# Patient Record
Sex: Female | Born: 2000 | Race: Black or African American | Hispanic: No | Smoking: Former smoker
Health system: Southern US, Community
[De-identification: ages and names within clinical notes are randomized; demographics above are authoritative.]

---
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
## 2021-02-17 ENCOUNTER — Other Ambulatory Visit: Payer: Self-pay

## 2021-02-17 ENCOUNTER — Encounter: Payer: Self-pay | Admitting: Adult Health

## 2021-02-17 ENCOUNTER — Ambulatory Visit (INDEPENDENT_AMBULATORY_CARE_PROVIDER_SITE_OTHER): Payer: Medicaid Other | Admitting: Adult Health

## 2021-02-17 VITALS — BP 128/84 | HR 94 | Ht 66.0 in | Wt 317.0 lb

## 2021-02-17 DIAGNOSIS — Z3202 Encounter for pregnancy test, result negative: Secondary | ICD-10-CM

## 2021-02-17 DIAGNOSIS — Z3041 Encounter for surveillance of contraceptive pills: Secondary | ICD-10-CM | POA: Diagnosis not present

## 2021-02-17 DIAGNOSIS — N946 Dysmenorrhea, unspecified: Secondary | ICD-10-CM

## 2021-02-17 DIAGNOSIS — F419 Anxiety disorder, unspecified: Secondary | ICD-10-CM

## 2021-02-17 DIAGNOSIS — F32A Depression, unspecified: Secondary | ICD-10-CM

## 2021-02-17 LAB — POCT URINE PREGNANCY: Preg Test, Ur: NEGATIVE

## 2021-02-17 MED ORDER — BUSPIRONE HCL 5 MG PO TABS: 5.0000 mg | ORAL_TABLET | Freq: Three times a day (TID) | ORAL | 3 refills | Status: DC

## 2021-02-17 MED ORDER — NORETHINDRONE ACET-ETHINYL EST 1.5-30 MG-MCG PO TABS: 1.0000 | ORAL_TABLET | Freq: Every day | ORAL | 11 refills | Status: DC

## 2021-02-17 MED ORDER — ESCITALOPRAM OXALATE 20 MG PO TABS: 20.0000 mg | ORAL_TABLET | Freq: Every day | ORAL | 6 refills | Status: DC

## 2021-02-17 NOTE — Progress Notes (Signed)
  Subjective:     Patient ID: Andrea Hartman, female   DOB: [birth year], 20 y.o.   MRN: [id number]  HPI Timica is a 20 year old black female,single,G0P0, back in follow up on taking loestrin 1/20 for birth control, and cramps and to regular periods and periods regualr but still having cramps and more mood swings. She last saw [facility] in December, and has not seen [facility] yet. Seems more anxious.  Review of Systems +cramps +mood swings +anxiety  Reviewed past medical,surgical, social and family history. Reviewed medications and allergies.     Objective:   Physical Exam BP 128/84 (BP Location: Right Arm, Patient Position: Sitting, Cuff Size: Large)   Pulse 94   Ht 5\' 6"  (1.676 m)   Wt (!) 317 lb (143.8 kg)   BMI 51.17 kg/m UPT is negative.   Skin warm and dry. Lungs: clear to ausculation bilaterally. Cardiovascular: regular rate and rhythm. Fall risk is low  Upstream - 02/17/21 1455      Pregnancy Intention Screening   Does the patient want to become pregnant in the next year? No    Would the patient like to discuss contraceptive options today? No      Contraception Wrap Up   Current Method Oral Contraceptive    End Method Oral Contraceptive    Contraception Counseling Provided No         PHQ 9 score is 24 but denies any SI or HI, is on lexapro 20 mg , needs to see [facility] GAD 7 score is 21, will add buspar  Assessment:     1. Negative pregnancy test   2. Menstrual cramps Will change OCs to see if helps  3. Anxiety and depression Will refill lexapro 20 mg and add buspar 5 mg tid  4. Encounter for surveillance of contraceptive pills Finish current OCs then start loestrin 1.5.30 Meds ordered this encounter  Medications  . escitalopram (LEXAPRO) 20 MG tablet    Sig: Take 1 tablet (20 mg total) by mouth daily.    Dispense:  30 tablet    Refill:  6    Order Specific Question:   Supervising Provider    Answer:   3.5.30, LUTHER H [[id number]]  . Norethindrone  Acetate-Ethinyl Estradiol (LOESTRIN) 1.5-30 MG-MCG tablet    Sig: Take 1 tablet by mouth daily.    Dispense:  28 tablet    Refill:  11    Order Specific Question:   Supervising Provider    Answer:   10-16-1978, LUTHER H [[id number]]  . busPIRone (BUSPAR) 5 MG tablet    Sig: Take 1 tablet (5 mg total) by mouth 3 (three) times daily.    Dispense:  90 tablet    Refill:  3    Order Specific Question:   Supervising Provider    Answer:   Despina Hidden [[id number]]      Plan:    Will check GC/CHL on urine Make appt with [facility] Follow up in 3 months or sooner if needed

## 2021-02-18 ENCOUNTER — Telehealth: Payer: Self-pay

## 2021-02-18 LAB — GC/CHLAMYDIA PROBE AMP
Chlamydia trachomatis, NAA: NEGATIVE
Neisseria Gonorrhoeae by PCR: NEGATIVE

## 2021-02-18 NOTE — Telephone Encounter (Signed)
Asked Cyril Mourning, NP for a clarification as requested from [facility] for Loestrin prescription. Obtained verbal order for either the Loestrin FE or Loestrin 21, whichever was in stock. Clarification accepted.

## 2021-03-11 DIAGNOSIS — M25562 Pain in left knee: Secondary | ICD-10-CM | POA: Diagnosis not present

## 2021-05-21 ENCOUNTER — Other Ambulatory Visit (HOSPITAL_COMMUNITY)
Admission: RE | Admit: 2021-05-21 | Discharge: 2021-05-21 | Disposition: A | Discharge status: Home / Self Care | Payer: Medicaid Other | Source: Ambulatory Visit | Attending: Adult Health | Admitting: Adult Health

## 2021-05-21 ENCOUNTER — Other Ambulatory Visit: Payer: Self-pay

## 2021-05-21 ENCOUNTER — Ambulatory Visit (INDEPENDENT_AMBULATORY_CARE_PROVIDER_SITE_OTHER): Payer: Medicaid Other | Admitting: Adult Health

## 2021-05-21 ENCOUNTER — Encounter: Payer: Self-pay | Admitting: Adult Health

## 2021-05-21 VITALS — BP 153/87 | HR 99 | Ht 66.0 in | Wt 322.0 lb

## 2021-05-21 DIAGNOSIS — F419 Anxiety disorder, unspecified: Secondary | ICD-10-CM | POA: Diagnosis not present

## 2021-05-21 DIAGNOSIS — F32A Depression, unspecified: Secondary | ICD-10-CM | POA: Diagnosis not present

## 2021-05-21 DIAGNOSIS — Z113 Encounter for screening for infections with a predominantly sexual mode of transmission: Secondary | ICD-10-CM | POA: Insufficient documentation

## 2021-05-21 DIAGNOSIS — N898 Other specified noninflammatory disorders of vagina: Secondary | ICD-10-CM | POA: Insufficient documentation

## 2021-05-21 DIAGNOSIS — Z3041 Encounter for surveillance of contraceptive pills: Secondary | ICD-10-CM

## 2021-05-21 DIAGNOSIS — R102 Pelvic and perineal pain: Secondary | ICD-10-CM | POA: Insufficient documentation

## 2021-05-21 NOTE — Progress Notes (Signed)
  Subjective:     Patient ID: Andrea Hartman, female   DOB: 25-Apr-2001, 20 y.o.   MRN: [id number]  HPI Andrea Hartman is a 20 year old black female, single, G0P0, in for follow up on starting Loestrin  1.5/30 and periods and cramps are better but have some pelvic pain. She has stopped lexapro and Buspar and did not go to [facility].   Review of Systems Period and cramps better +pelvic pain,more when bleeding  Denies any new partners or pain with sex  Reviewed past medical,surgical, social and family history. Reviewed medications and allergies.     Objective:   Physical Exam BP (!) 153/87 (BP Location: Right Arm, Patient Position: Sitting, Cuff Size: Normal)   Pulse 99   Ht 5\' 6"  (1.676 m)   Wt (!) 322 lb (146.1 kg)   LMP 05/07/2021 (Within Days)   BMI 51.97 kg/m   Skin warm and dry.Pelvic: external genitalia is normal in appearance no lesions, vagina: white discharge with slight odor,urethra has no lesions or masses noted, cervix:smooth, uterus: normal size, shape and contour, non tender, no masses felt, adnexa: no masses, +RLQ tenderness noted. Bladder is non tender and no masses felt. CV swab obtained  Upstream - 05/21/21 1424       Pregnancy Intention Screening   Does the patient want to become pregnant in the next year? No    Does the patient's partner want to become pregnant in the next year? No    Would the patient like to discuss contraceptive options today? No      Contraception Wrap Up   Current Method Oral Contraceptive    End Method Oral Contraceptive    Contraception Counseling Provided No             Depression screen [facility] 2/9 05/21/2021 02/17/2021 11/17/2020  Decreased Interest 1 2 3   Down, Depressed, Hopeless 1 2 3   PHQ - 2 Score 2 4 6   Altered sleeping 0 3 3  Tired, decreased energy 0 2 3  Change in appetite 1 3 3   Feeling bad or failure about yourself  1 3 2   Trouble concentrating 0 3 3  Moving slowly or fidgety/restless 0 3 2  Suicidal thoughts 0 3 3   PHQ-9 Score 4 24 25   Difficult doing work/chores - - -    GAD 7 : Generalized Anxiety Score 05/21/2021 02/17/2021 11/17/2020 11/05/2020  Nervous, Anxious, on Edge 1 3 3 1   Control/stop worrying 0 3 3 3   Worry too much - different things 0 3 3 3   Trouble relaxing 0 3 3 3   Restless 0 3 1 2   Easily annoyed or irritable 2 3 3 3   Afraid - awful might happen 0 3 3 3   Total GAD 7 Score 3 21 19 18     Examination chaperoned by LPN    Assessment:        1. Encounter for surveillance of contraceptive pills Continue Loestrin 1.5/30 has refills  2. Pelvic pain Will get pelvic 6/14 at 2:30 pm at [facility]  3. Vaginal discharge CV swab sent  4. Screening examination for STD (sexually transmitted disease) CV swab sent  5. Anxiety and depression She declines meds, and declines being SI or HI    Plan:     Will talk when results back Follow up prn

## 2021-05-24 LAB — CERVICOVAGINAL ANCILLARY ONLY
Bacterial Vaginitis (gardnerella): NEGATIVE
Candida Glabrata: NEGATIVE
Candida Vaginitis: NEGATIVE
Chlamydia: NEGATIVE
Comment: NEGATIVE
Comment: NEGATIVE
Comment: NEGATIVE
Comment: NEGATIVE
Comment: NEGATIVE
Comment: NORMAL
Neisseria Gonorrhea: NEGATIVE
Trichomonas: NEGATIVE

## 2021-05-26 ENCOUNTER — Other Ambulatory Visit: Payer: Self-pay

## 2021-05-26 ENCOUNTER — Other Ambulatory Visit: Payer: Self-pay | Admitting: Adult Health

## 2021-05-26 ENCOUNTER — Ambulatory Visit (HOSPITAL_COMMUNITY)
Admission: RE | Admit: 2021-05-26 | Discharge: 2021-05-26 | Disposition: A | Discharge status: Home / Self Care | Payer: Medicaid Other | Source: Ambulatory Visit | Attending: Adult Health | Admitting: Adult Health

## 2021-05-26 DIAGNOSIS — R102 Pelvic and perineal pain: Secondary | ICD-10-CM | POA: Insufficient documentation

## 2022-01-04 ENCOUNTER — Ambulatory Visit (INDEPENDENT_AMBULATORY_CARE_PROVIDER_SITE_OTHER): Payer: Medicaid Other | Admitting: Adult Health

## 2022-01-04 ENCOUNTER — Encounter: Payer: Self-pay | Admitting: Adult Health

## 2022-01-04 ENCOUNTER — Other Ambulatory Visit: Payer: Self-pay

## 2022-01-04 VITALS — BP 145/93 | HR 84 | Ht 66.0 in | Wt 327.0 lb

## 2022-01-04 DIAGNOSIS — Z3041 Encounter for surveillance of contraceptive pills: Secondary | ICD-10-CM

## 2022-01-04 DIAGNOSIS — N946 Dysmenorrhea, unspecified: Secondary | ICD-10-CM

## 2022-01-04 MED ORDER — IBUPROFEN 800 MG PO TABS: 800.0000 mg | ORAL_TABLET | Freq: Three times a day (TID) | ORAL | 3 refills | Status: DC | PRN

## 2022-01-04 MED ORDER — NORETHINDRONE ACET-ETHINYL EST 1.5-30 MG-MCG PO TABS: 1.0000 | ORAL_TABLET | Freq: Every day | ORAL | 11 refills | Status: DC

## 2022-01-04 NOTE — Progress Notes (Signed)
°  Subjective:     Patient ID: Andrea Hartman, female   DOB: 2001-06-09, 21 y.o.   MRN: [id number]  HPI Andrea Hartman is a 21 year old black female,single, G0P0, in to get OCs refilled, she is happy with them.   Review of Systems Periods good Has menstrual cramps 1-2 days a cycle No pain with sex Reviewed past medical,surgical, social and family history. Reviewed medications and allergies.     Objective:   Physical Exam BP (!) 145/93 (BP Location: Right Arm, Patient Position: Sitting, Cuff Size: Normal)    Pulse 84    Ht 5\' 6"  (1.676 m)    Wt (!) 327 lb (148.3 kg)    LMP 12/13/2021 (Approximate)    BMI 52.78 kg/m     Skin warm and dry. Lungs: clear to ausculation bilaterally. Cardiovascular: regular rate and rhythm.  Fall risk is low  Upstream - 01/04/22 1205       Pregnancy Intention Screening   Does the patient want to become pregnant in the next year? No    Does the patient's partner want to become pregnant in the next year? No    Would the patient like to discuss contraceptive options today? No      Contraception Wrap Up   Current Method Oral Contraceptive    End Method Oral Contraceptive    Contraception Counseling Provided No             Assessment:     1. Encounter for surveillance of contraceptive pills Will refill OCs Meds ordered this encounter  Medications   Norethindrone Acetate-Ethinyl Estradiol (LOESTRIN) 1.5-30 MG-MCG tablet    Sig: Take 1 tablet by mouth daily.    Dispense:  28 tablet    Refill:  11    Order Specific Question:   Supervising Provider    Answer:   10-16-1978 H [[id number]]   ibuprofen (ADVIL) 800 MG tablet    Sig: Take 1 tablet (800 mg total) by mouth every 8 (eight) hours as needed.    Dispense:  60 tablet    Refill:  3    Order Specific Question:   Supervising Provider    Answer:   Duane Lope, LUTHER H [[id number]]     2. Menstrual cramps Has cramps 1-2 days per cycle will refill advil    Plan:     Follow up in 1 year or sooner if  needed

## 2022-03-24 ENCOUNTER — Ambulatory Visit (INDEPENDENT_AMBULATORY_CARE_PROVIDER_SITE_OTHER): Payer: Medicaid Other | Admitting: Family Medicine

## 2022-03-24 ENCOUNTER — Encounter: Payer: Self-pay | Admitting: Family Medicine

## 2022-03-24 VITALS — BP 114/72 | HR 98 | Ht 66.0 in | Wt 327.8 lb

## 2022-03-24 DIAGNOSIS — Z114 Encounter for screening for human immunodeficiency virus [HIV]: Secondary | ICD-10-CM | POA: Diagnosis not present

## 2022-03-24 DIAGNOSIS — E559 Vitamin D deficiency, unspecified: Secondary | ICD-10-CM | POA: Diagnosis not present

## 2022-03-24 DIAGNOSIS — H5203 Hypermetropia, bilateral: Secondary | ICD-10-CM | POA: Diagnosis not present

## 2022-03-24 DIAGNOSIS — G43109 Migraine with aura, not intractable, without status migrainosus: Secondary | ICD-10-CM

## 2022-03-24 DIAGNOSIS — H5713 Ocular pain, bilateral: Secondary | ICD-10-CM | POA: Diagnosis not present

## 2022-03-24 DIAGNOSIS — F32A Depression, unspecified: Secondary | ICD-10-CM

## 2022-03-24 DIAGNOSIS — F419 Anxiety disorder, unspecified: Secondary | ICD-10-CM | POA: Diagnosis not present

## 2022-03-24 DIAGNOSIS — S83005A Unspecified dislocation of left patella, initial encounter: Secondary | ICD-10-CM | POA: Diagnosis not present

## 2022-03-24 DIAGNOSIS — Z7689 Persons encountering health services in other specified circumstances: Secondary | ICD-10-CM | POA: Diagnosis not present

## 2022-03-24 DIAGNOSIS — S83006A Unspecified dislocation of unspecified patella, initial encounter: Secondary | ICD-10-CM | POA: Insufficient documentation

## 2022-03-24 DIAGNOSIS — Z1159 Encounter for screening for other viral diseases: Secondary | ICD-10-CM

## 2022-03-24 DIAGNOSIS — G43909 Migraine, unspecified, not intractable, without status migrainosus: Secondary | ICD-10-CM | POA: Insufficient documentation

## 2022-03-24 MED ORDER — MAGNESIUM OXIDE 400 MG PO CAPS: 400.0000 mg | ORAL_CAPSULE | Freq: Every day | ORAL | 1 refills | Status: DC

## 2022-03-24 MED ORDER — FLUOXETINE HCL 10 MG PO TABS: 10.0000 mg | ORAL_TABLET | Freq: Every day | ORAL | 3 refills | Status: DC

## 2022-03-24 NOTE — Patient Instructions (Addendum)
I appreciate the opportunity to provide care to you today! ?  ?-Follow up: 6 months ? ?-please stop by the lab to get your blood drawn today ? ?-Referrals today- ophthalmologist to exam your eyes and referral to orthopedic surgeon for your left knee ? ?-Screenings: Hep C and HIV ? ?-Recommend wearing a knee cap on the left knee to prevent the patella from popping in and out.  ? ?-Please pick up your prescription at the pharmacy.  ? ?-I've ordered Prozac for your anxiety and magnesium oxide for your headaches ? ?  ? ? ? ?  ?It was a pleasure to see you and I look forward to continuing to work together on your health and well-being. ?Please do not hesitate to call the office if you need care or have questions about your care. ?  ?Have a wonderful day and week. ?With Gratitude, ?Gilmore Laroche MSN, FNP-BC  ?

## 2022-03-24 NOTE — Assessment & Plan Note (Signed)
-  Prozac ordered ?-Informed patient that it may take 2-4 weeks to feel the full benefit of the drug ?

## 2022-03-24 NOTE — Assessment & Plan Note (Signed)
-  No pain or dislocation of the patellar socket today ?- Referral to orthopedic surgery ?

## 2022-03-24 NOTE — Progress Notes (Signed)
? ?New Patient Office Visit ? ?Subjective:  ?Patient ID: Andrea Hartman, female    DOB: 17-Mar-2001  Age: 21 y.o. MRN: [id number] ? ?CC:  ?Chief Complaint  ?Patient presents with  ? New Patient (Initial Visit)  ?  Left leg pops out of place at times. Complains of anxiety and migraines.   ? ? ?HPI ?Andrea Hartman is a 21 y.o. female with past medical history of PCOS, anxiety and depression, asthma, presents for establishing care. ? ?Anxiety: onset since high school and has increased over the years. The patient states that she cannot do anything spontaneously planned and often has panic attacks when things go wrong.  ? ?Migraines: onset since Dec of last year with spontaneous occurrences. The migraines last from a few hours to all day. The pain is described as stabbing and starts behind both eyes and radiates to the head. The patient denies lacrimation, nasal congestion, and clear rhinitis. The patient reports sensitivity to bright lights and noise and takes Motrin, which provides minimum relief. ? ?Left leg pain: onset since last year when she hurt herself on a trampoline. She reported going to urgent care with instructions to apply ice and elevate her legs. Since the injury, she says her left knee has been popping in and out of the socket, with reported pain when the knee pops out of place. The patient reports being unable to walk or apply weight when the knee dislocates. The patient wrapped her legs, used ice, and took weight off the joint to alleviate the pain. The patient notes a popping sound when the knee pops out of the socket. ? ? Eyes: Last eye exam was in 2020. The patient has been prescribed glasses for hyperopia but doesn't use them.  ? ? Prediabetes: pt reports a diagnosis of prediabetes but doesn't take prescribed medication.  ? ? ? ?Past Medical History:  ?Diagnosis Date  ? Asthma   ? ? ?History reviewed. No pertinent surgical history. ? ?Family History  ?Problem Relation Age of  Onset  ? Diabetes Mother   ? Hypertension Mother   ? Diabetes Maternal Grandmother   ? Hypertension Maternal Grandmother   ? Other Maternal Grandmother   ?     vertigo  ? Healthy Sister   ? Healthy Sister   ? ? ?Social History  ? ?Socioeconomic History  ? Marital status: Single  ?  Spouse name: Not on file  ? Number of children: Not on file  ? Years of education: Not on file  ? Highest education level: Not on file  ?Occupational History  ? Not on file  ?Tobacco Use  ? Smoking status: Former  ?  Types: E-cigarettes  ? Smokeless tobacco: Never  ?Vaping Use  ? Vaping Use: Former  ?Substance and Sexual Activity  ? Alcohol use: Not Currently  ? Drug use: Not Currently  ? Sexual activity: Yes  ?  Birth control/protection: Pill  ?Other Topics Concern  ? Not on file  ?Social History Narrative  ? Not on file  ? ?Social Determinants of Health  ? ?Financial Resource Strain: Not on file  ?Food Insecurity: Not on file  ?Transportation Needs: Not on file  ?Physical Activity: Not on file  ?Stress: Not on file  ?Social Connections: Not on file  ?Intimate Partner Violence: Not on file  ? ? ?ROS ?Review of Systems  ?Constitutional:  Negative for chills, fatigue and fever.  ?HENT:  Negative for facial swelling, rhinorrhea, sinus pressure and sinus pain.   ?  Eyes:  Positive for pain. Negative for photophobia and redness.  ?     Slight eye pain and blurred vision yesterday  ?Respiratory:  Negative for cough, chest tightness and shortness of breath.   ?Cardiovascular:  Negative for chest pain, palpitations and leg swelling.  ?Gastrointestinal:  Positive for nausea. Negative for constipation, diarrhea and vomiting.  ?Endocrine: Positive for polydipsia and polyuria. Negative for polyphagia.  ?Genitourinary:  Negative for decreased urine volume, dysuria, vaginal bleeding and vaginal discharge.  ?Musculoskeletal:  Positive for neck pain. Negative for back pain and neck stiffness.  ?     From her sleeping position  ?Skin:  Negative for rash  and wound.  ?Neurological:  Positive for dizziness. Negative for tremors, weakness and headaches.  ?Psychiatric/Behavioral:  Negative for self-injury and suicidal ideas.   ? ?Objective:  ? ?Today's Vitals: BP 114/72 (BP Location: Right Arm, Patient Position: Sitting)   Pulse 98   Ht '5\' 6"'  (1.676 m)   Wt (!) 327 lb 12.8 oz (148.7 kg)   SpO2 100%   BMI 52.91 kg/m?  ? ?Physical Exam ?Constitutional:   ?   Appearance: She is obese.  ?HENT:  ?   Head: Normocephalic.  ?   Right Ear: External ear normal.  ?   Left Ear: External ear normal.  ?   Nose: No congestion or rhinorrhea.  ?   Mouth/Throat:  ?   Mouth: Mucous membranes are moist.  ?Eyes:  ?   General:     ?   Right eye: No discharge.     ?   Left eye: No discharge.  ?   Extraocular Movements: Extraocular movements intact.  ?   Pupils: Pupils are equal, round, and reactive to light.  ?Neck:  ?   Vascular: No carotid bruit.  ?Cardiovascular:  ?   Rate and Rhythm: Normal rate and regular rhythm.  ?   Pulses: Normal pulses.  ?   Heart sounds: Normal heart sounds.  ?Pulmonary:  ?   Effort: Pulmonary effort is normal.  ?   Breath sounds: Normal breath sounds. No wheezing.  ?Abdominal:  ?   General: Bowel sounds are normal. There is no distension.  ?   Tenderness: There is no abdominal tenderness.  ?Musculoskeletal:  ?   Cervical back: Normal range of motion.  ?   Right knee: No swelling, deformity or erythema. Normal range of motion. No tenderness. Normal pulse.  ?   Left knee: No swelling, deformity or erythema. Normal range of motion. No tenderness. Normal pulse.  ?   Right lower leg: No edema.  ?Lymphadenopathy:  ?   Cervical: No cervical adenopathy.  ?Skin: ?   Findings: No bruising or lesion.  ?Neurological:  ?   Mental Status: She is alert and oriented to person, place, and time.  ?   Motor: No weakness.  ?Psychiatric:  ?   Comments: Flat affect  ? ? ?Assessment & Plan:  ? ?Problem List Items Addressed This Visit   ? ?  ? Cardiovascular and Mediastinum  ?  Migraines  ?  - her worsening symptoms are likely due to the straing of her eyes from not wearing her glasses. ?-- magnesium oxide ordered to help prevent migraine headaches ?  ?  ? Relevant Medications  ? Magnesium Oxide 400 MG CAPS  ? FLUoxetine (PROZAC) 10 MG tablet  ?  ? Musculoskeletal and Integument  ? Patellar dislocation - Primary  ?  -No pain or dislocation of the patellar socket  today ?- Referral to orthopedic surgery ?  ?  ? Relevant Orders  ? Ambulatory referral to Orthopedic Surgery  ?  ? Other  ? Anxiety and depression  ?  -Prozac ordered ?-Informed patient that it may take 2-4 weeks to feel the full benefit of the drug ?  ?  ? Relevant Medications  ? FLUoxetine (PROZAC) 10 MG tablet  ? Eye pain, bilateral  ?  Referral made to optometrist for eye exam and prescription for new glasses. ?  ?  ? ?Other Visit Diagnoses   ? ? Hyperopia of both eyes      ? Relevant Orders  ? Ambulatory referral to Ophthalmology  ? Anxiety      ? Relevant Medications  ? FLUoxetine (PROZAC) 10 MG tablet  ? Encounter to establish care      ? Relevant Orders  ? CBC with Differential/Platelet  ? CMP14+EGFR  ? TSH + free T4  ? Hemoglobin A1C  ? Lipid panel  ? Vitamin D deficiency      ? Relevant Orders  ? Vitamin D (25 hydroxy)  ? Encounter for screening for HIV      ? Relevant Orders  ? HIV antibody (with reflex)  ? Need for hepatitis C screening test      ? Relevant Orders  ? Hepatitis C Antibody  ? ?  ? ? ?Outpatient Encounter Medications as of 03/24/2022  ?Medication Sig  ? FLUoxetine (PROZAC) 10 MG tablet Take 1 tablet (10 mg total) by mouth daily.  ? ibuprofen (ADVIL) 800 MG tablet Take 1 tablet (800 mg total) by mouth every 8 (eight) hours as needed.  ? Magnesium Oxide 400 MG CAPS Take 1 capsule (400 mg total) by mouth daily.  ? Norethindrone Acetate-Ethinyl Estradiol (LOESTRIN) 1.5-30 MG-MCG tablet Take 1 tablet by mouth daily.  ? ?No facility-administered encounter medications on file as of 03/24/2022.  ? ? ?Follow-up:  Return in about 3 months (around 06/23/2022) for physical exam.  ? ?Alvira Monday, FNP ?

## 2022-03-24 NOTE — Assessment & Plan Note (Signed)
Referral made to optometrist for eye exam and prescription for new glasses. ?

## 2022-03-24 NOTE — Assessment & Plan Note (Addendum)
-   her worsening symptoms are likely due to the straing of her eyes from not wearing her glasses. ?-- magnesium oxide ordered to help prevent migraine headaches ?

## 2022-03-25 ENCOUNTER — Telehealth: Payer: Self-pay | Admitting: Family Medicine

## 2022-03-25 LAB — LIPID PANEL
Chol/HDL Ratio: 3.9 ratio (ref 0.0–4.4)
Cholesterol, Total: 211 mg/dL — ABNORMAL HIGH (ref 100–199)
HDL: 54 mg/dL (ref >39)
LDL Chol Calc (NIH): 135 mg/dL — ABNORMAL HIGH (ref 0–99)
Triglycerides: 122 mg/dL (ref 0–149)
VLDL Cholesterol Cal: 22 mg/dL (ref 5–40)

## 2022-03-25 LAB — CMP14+EGFR
ALT: 22 IU/L (ref 0–32)
AST: 10 IU/L (ref 0–40)
Albumin/Globulin Ratio: 1.3 (ref 1.2–2.2)
Albumin: 4.1 g/dL (ref 3.9–5.0)
Alkaline Phosphatase: 99 IU/L (ref 42–106)
BUN/Creatinine Ratio: 15 (ref 9–23)
BUN: 12 mg/dL (ref 6–20)
Bilirubin Total: 0.2 mg/dL (ref 0.0–1.2)
CO2: 22 mmol/L (ref 20–29)
Calcium: 9.8 mg/dL (ref 8.7–10.2)
Chloride: 100 mmol/L (ref 96–106)
Creatinine, Ser: 0.81 mg/dL (ref 0.57–1.00)
Globulin, Total: 3.1 g/dL (ref 1.5–4.5)
Glucose: 90 mg/dL (ref 70–99)
Potassium: 4.9 mmol/L (ref 3.5–5.2)
Sodium: 137 mmol/L (ref 134–144)
Total Protein: 7.2 g/dL (ref 6.0–8.5)
eGFR: 107 mL/min/{1.73_m2} (ref >59)

## 2022-03-25 LAB — HEMOGLOBIN A1C
Est. average glucose Bld gHb Est-mCnc: 117 mg/dL
Hgb A1c MFr Bld: 5.7 % — ABNORMAL HIGH (ref 4.8–5.6)

## 2022-03-25 LAB — HEPATITIS C ANTIBODY: Hep C Virus Ab: NONREACTIVE

## 2022-03-25 LAB — HIV ANTIBODY (ROUTINE TESTING W REFLEX): HIV Screen 4th Generation wRfx: NONREACTIVE

## 2022-03-25 LAB — TSH+FREE T4
Free T4: 1.21 ng/dL (ref 0.82–1.77)
TSH: 3.12 u[IU]/mL (ref 0.450–4.500)

## 2022-03-25 LAB — CBC WITH DIFFERENTIAL/PLATELET
Basophils Absolute: 0 10*3/uL (ref 0.0–0.2)
Basos: 0 %
EOS (ABSOLUTE): 0.1 10*3/uL (ref 0.0–0.4)
Eos: 1 %
Hematocrit: 40.4 % (ref 34.0–46.6)
Hemoglobin: 13.1 g/dL (ref 11.1–15.9)
Immature Grans (Abs): 0 10*3/uL (ref 0.0–0.1)
Immature Granulocytes: 0 %
Lymphocytes Absolute: 2.7 10*3/uL (ref 0.7–3.1)
Lymphs: 29 %
MCH: 25.7 pg — ABNORMAL LOW (ref 26.6–33.0)
MCHC: 32.4 g/dL (ref 31.5–35.7)
MCV: 79 fL (ref 79–97)
Monocytes Absolute: 0.6 10*3/uL (ref 0.1–0.9)
Monocytes: 6 %
Neutrophils Absolute: 6 10*3/uL (ref 1.4–7.0)
Neutrophils: 64 %
Platelets: 425 10*3/uL (ref 150–450)
RBC: 5.09 x10E6/uL (ref 3.77–5.28)
RDW: 14.8 % (ref 11.7–15.4)
WBC: 9.5 10*3/uL (ref 3.4–10.8)

## 2022-03-25 LAB — VITAMIN D 25 HYDROXY (VIT D DEFICIENCY, FRACTURES): Vit D, 25-Hydroxy: 21.1 ng/mL — ABNORMAL LOW (ref 30.0–100.0)

## 2022-03-25 NOTE — Telephone Encounter (Signed)
Return call

## 2022-03-26 NOTE — Telephone Encounter (Signed)
Patient returning call about lab results. Please return patient call.  

## 2022-03-26 NOTE — Telephone Encounter (Signed)
Returned phone call left v/m

## 2022-03-29 IMAGING — CT CT ABD-PELV W/ CM
2 of 4 series · 16 of 46 positions shown, 18 images · IV contrast (Omnipaque or Isovue)
Comparison: None.

CLINICAL DATA: Abdominal pain and nausea

EXAM:
CT ABDOMEN AND PELVIS WITH CONTRAST
TECHNIQUE: Multidetector CT imaging of the abdomen and pelvis was performed
using the standard protocol following bolus administration of
intravenous contrast.
CONTRAST:  100mL OMNIPAQUE IOHEXOL 300 MG/ML  SOLN

[Series 2: axial st · axial · 0.89mm/px · z∈[+1055,+1505]mm · 13 of 100 slices shown, 15 images]
[im 5/100  soft-tissue]
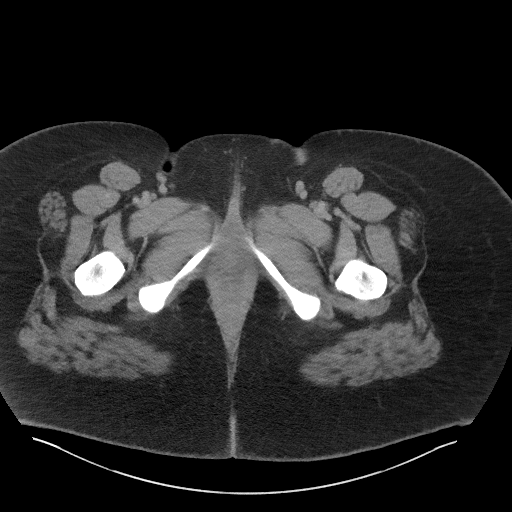
[im 5/100  bone]
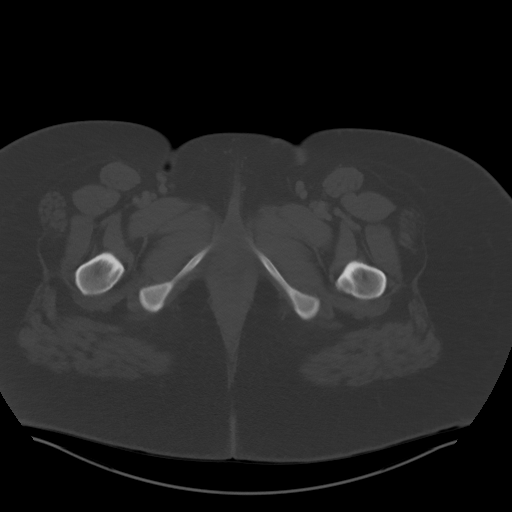
[im 13/100  soft-tissue]
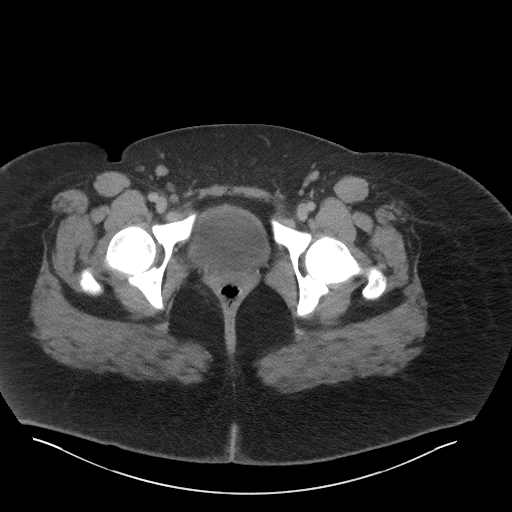
[im 22/100  soft-tissue]
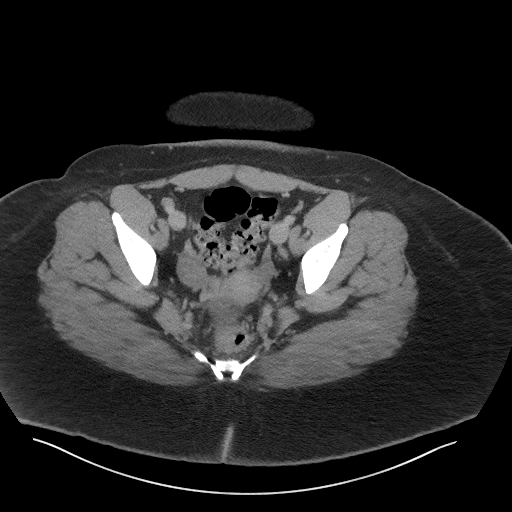
[im 26/100  soft-tissue]
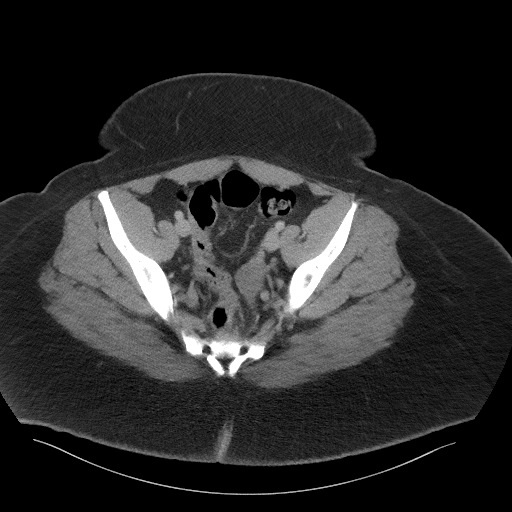
[im 35/100  soft-tissue]
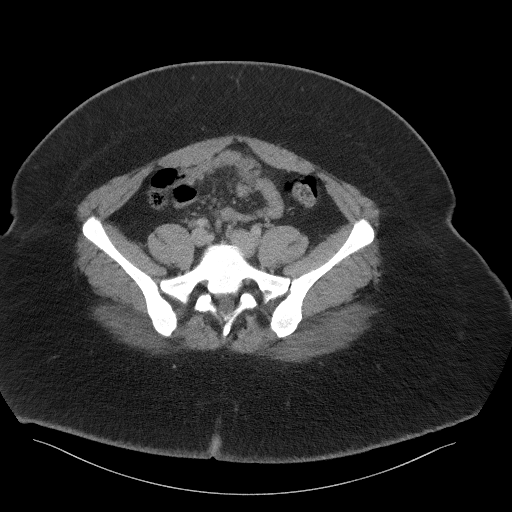
[im 44/100  soft-tissue]
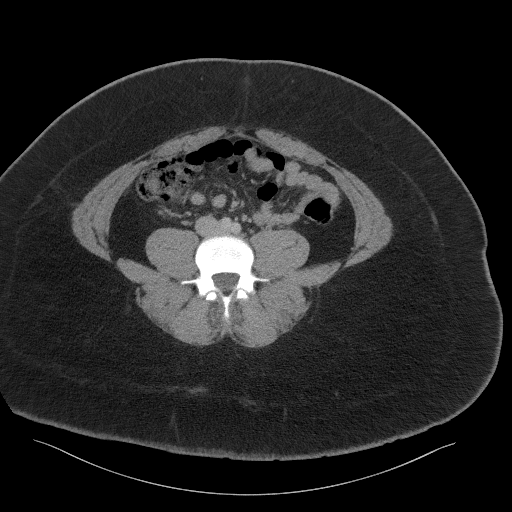
[im 52/100  soft-tissue]
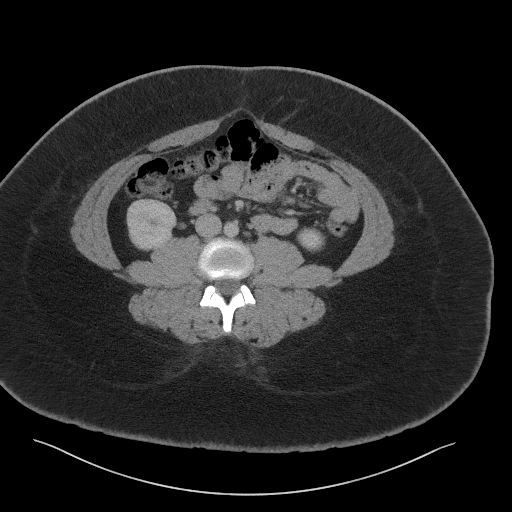
[im 56/100  soft-tissue]
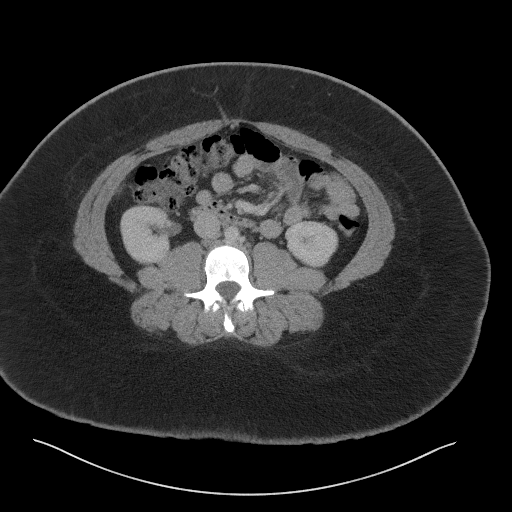
[im 65/100  soft-tissue]
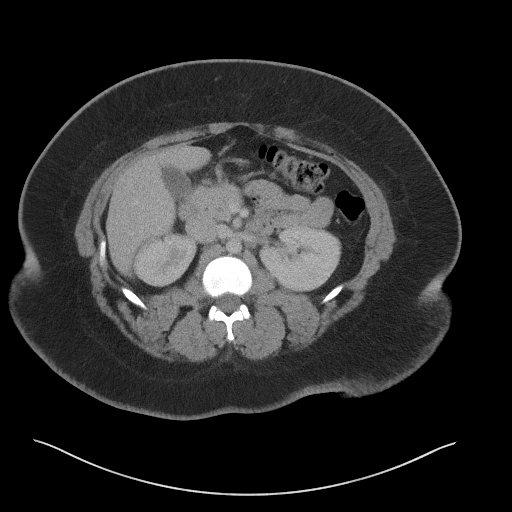
[im 65/100  bone]
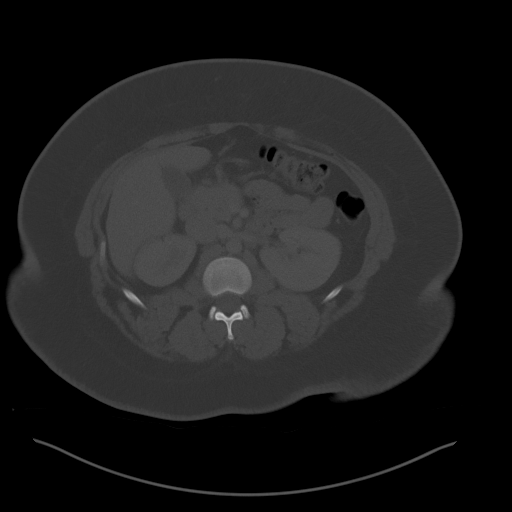
[im 74/100  soft-tissue]
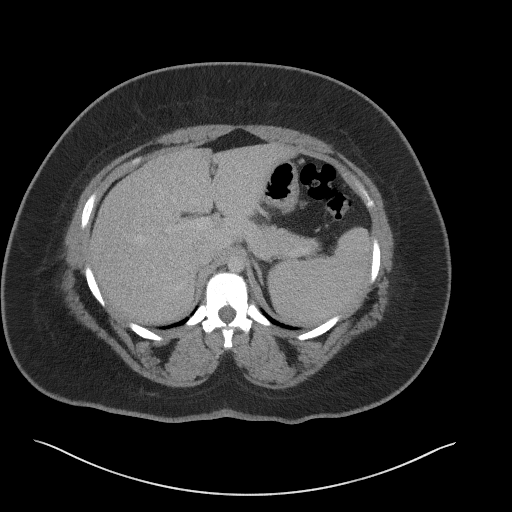
[im 78/100  soft-tissue]
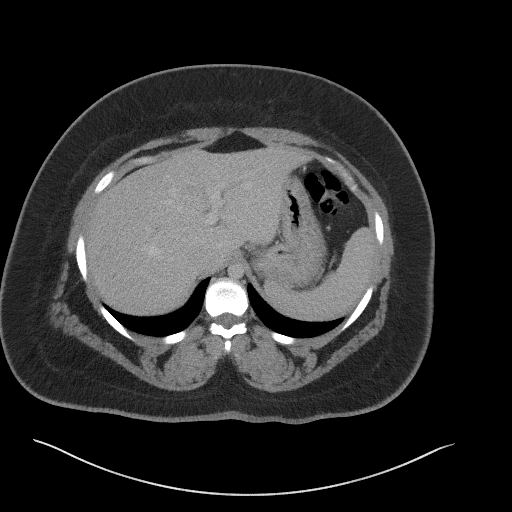
[im 87/100  soft-tissue]
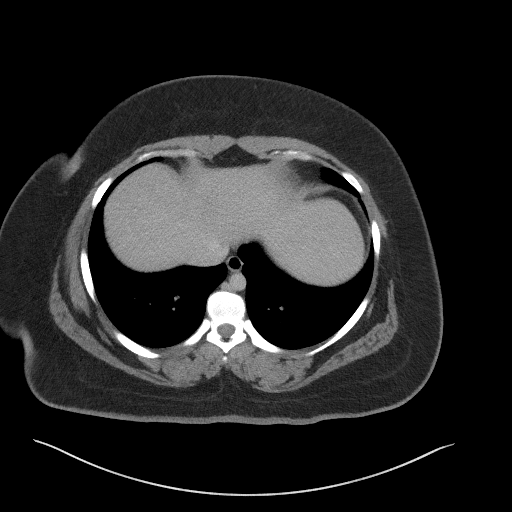
[im 95/100  soft-tissue]
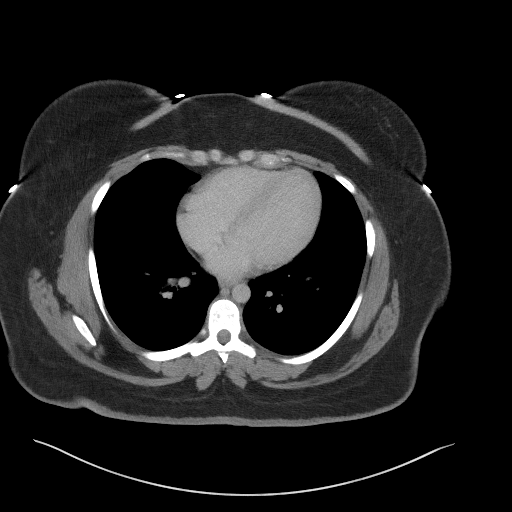

[Series 5: coronal st · coronal · 0.87mm/px · 3 of 127 slices shown]
[im 43/127  soft-tissue]
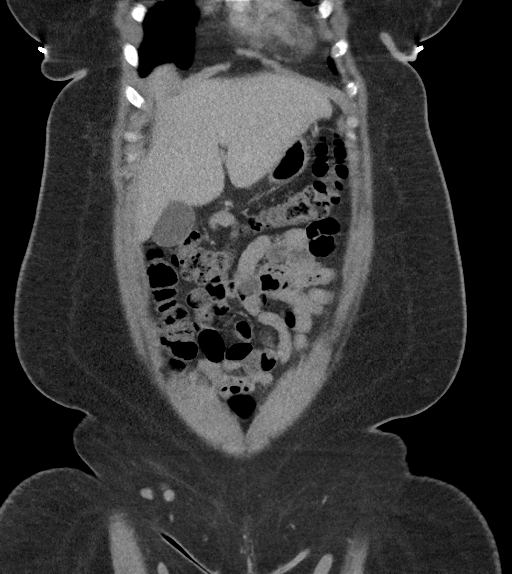
[im 57/127  soft-tissue]
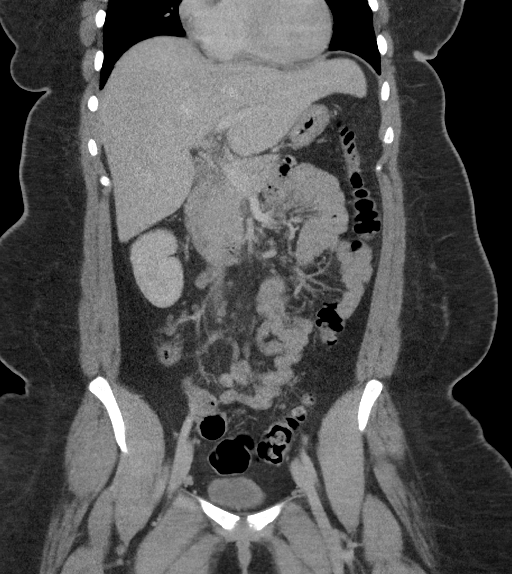
[im 71/127  soft-tissue]
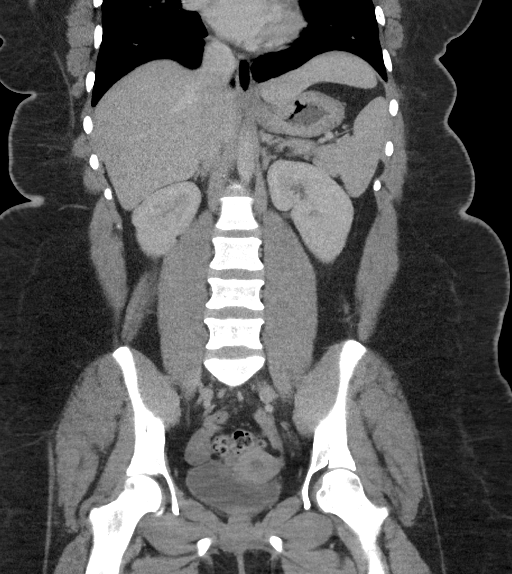

[16 of 46 positions shown; findings below may reference images not displayed]

FINDINGS: Lower chest: Lung bases are clear.

Hepatobiliary: No focal liver lesions are appreciable. The
gallbladder wall is not appreciably thickened. There is no biliary
duct dilatation.

Pancreas: There is no pancreatic mass or inflammatory focus.

Spleen: No splenic lesions are evident.

Adrenals/Urinary Tract: Adrenals bilaterally appear normal. Kidneys
bilaterally show no evident mass or hydronephrosis on either side.
There is no appreciable renal or ureteral calculus on either side.
Urinary bladder is midline with wall thickness within normal limits.

Stomach/Bowel: There is no appreciable bowel wall or mesenteric
thickening. Terminal ileum appears normal. There is no evident bowel
obstruction. There is no free air or portal venous air.

Vascular/Lymphatic: No abdominal aortic aneurysm. No arterial
vascular lesions are evident. Major venous structures appear patent.
There is a circumaortic left renal vein, an anatomic variant. There
is no appreciable adenopathy by size criteria in the abdomen or
pelvis. There are several subcentimeter right lower abdominal
mesenteric lymph nodes. There also subcentimeter inguinal and common
femoral node chain lymph nodes, regarded as nonspecific.

Reproductive: Uterus is anteverted.  No evident pelvic mass.

Other: The appendix appears normal. There is no evident abscess or
ascites in the abdomen or pelvis. There is a fairly small ventral
hernia containing only fat.

Musculoskeletal: No blastic or lytic bone lesions. No intramuscular
lesions are appreciable.
IMPRESSION: 1. Subcentimeter right lower quadrant mesenteric lymph nodes. No
adenopathy by size criteria evident in the abdomen or pelvis. These
lymph nodes in the right lower quadrant in the appropriate clinical
setting potentially could indicate a degree of mesenteric adenitis.

2. No bowel obstruction. No abscess in the abdomen pelvis. Appendix
appears normal.

3. No renal or ureteral calculus. No hydronephrosis. Urinary bladder
wall thickness normal.

4.  Fairly small umbilical hernia containing fat but no bowel.

## 2022-03-30 ENCOUNTER — Telehealth: Payer: Self-pay | Admitting: Radiology

## 2022-03-30 NOTE — Telephone Encounter (Signed)
Patient called LMVM and I could not understand what was needed.  I called her LM for her to call us back.  ?

## 2022-04-01 ENCOUNTER — Telehealth: Payer: Self-pay

## 2022-04-01 ENCOUNTER — Ambulatory Visit (HOSPITAL_COMMUNITY)
Admission: RE | Admit: 2022-04-01 | Discharge: 2022-04-01 | Disposition: A | Discharge status: Home / Self Care | Payer: Medicaid Other | Source: Ambulatory Visit | Attending: Family Medicine | Admitting: Family Medicine

## 2022-04-01 ENCOUNTER — Ambulatory Visit: Payer: Medicaid Other | Admitting: Orthopedic Surgery

## 2022-04-01 DIAGNOSIS — S83005A Unspecified dislocation of left patella, initial encounter: Secondary | ICD-10-CM | POA: Insufficient documentation

## 2022-04-01 DIAGNOSIS — M25562 Pain in left knee: Secondary | ICD-10-CM | POA: Diagnosis not present

## 2022-04-01 NOTE — Addendum Note (Signed)
Addended by: Herbie Saxon on: 04/01/2022 10:28 AM ? ? Modules accepted: Orders ? ?

## 2022-04-01 NOTE — Telephone Encounter (Signed)
Andrea Hartman from [facility] Radiology department called does not have an order for xray, patient there now. ?

## 2022-04-06 NOTE — Progress Notes (Signed)
Please inform the patient that her X-ray of left knee shows no evidence of fractures or dislocation.

## 2022-04-06 NOTE — Progress Notes (Signed)
Pt informed

## 2022-04-09 ENCOUNTER — Ambulatory Visit (INDEPENDENT_AMBULATORY_CARE_PROVIDER_SITE_OTHER): Payer: Medicaid Other | Admitting: Orthopedic Surgery

## 2022-04-09 ENCOUNTER — Encounter: Payer: Self-pay | Admitting: Orthopedic Surgery

## 2022-04-09 ENCOUNTER — Ambulatory Visit (INDEPENDENT_AMBULATORY_CARE_PROVIDER_SITE_OTHER): Payer: Medicaid Other

## 2022-04-09 DIAGNOSIS — M25562 Pain in left knee: Secondary | ICD-10-CM | POA: Diagnosis not present

## 2022-04-09 DIAGNOSIS — G8929 Other chronic pain: Secondary | ICD-10-CM | POA: Diagnosis not present

## 2022-04-09 NOTE — Patient Instructions (Signed)
Physical therapy has been ordered for you at [facility]. They should call you to schedule, [phone] is the phone number to call, if you want to call to schedule.   ? ?Follow up in 7 weeks after PT ?

## 2022-04-09 NOTE — Progress Notes (Signed)
Chief Complaint  ?Patient presents with  ? Knee Pain  ?  LT/ hurt it about a year ago/ hurt it on the trampoline  ? ? ?HPI: 21 year old female comes to [place] with history of patella subluxation/dislocation but no manual reduction ever performed by her or in the ER.  About a year ago she says her knee popped when she was playing on a trampoline since that time she says that the knee has been popping in and out of socket she has pain when it pops and she is unable to bear weight ? ?She also has polycystic ovarian disease she is morbidly obese ? ?It is unclear at this time whether she had a true patellar dislocation ? ?Past Medical History:  ?Diagnosis Date  ? Asthma   ? ? ?There were no vitals taken for this visit. ? ? ?General appearance: Well-developed well-nourished no gross deformities ? ?Cardiovascular normal pulse and perfusion normal color without edema ? ?Neurologically no sensation loss or deficits or pathologic reflexes ? ?Psychological: Awake alert and oriented x3 mood and affect normal ? ?Skin no lacerations or ulcerations no nodularity no palpable masses, no erythema or nodularity ? ?Musculoskeletal: Full range of motion of the knee stable in all planes I could not get the patella to subluxate or dislocate there is no apprehension the only pain I was able to reproduce with some mild medial condylar tenderness ? ?Imaging x-rays today were negative ? ?A/P ? ?Assume patellar subluxation dislocation recommend physical therapy if no improvement we can get an MRI and there is always a chance she could have had an ACL injury as well ?

## 2022-04-16 ENCOUNTER — Encounter: Payer: Self-pay | Admitting: Radiology

## 2022-05-17 ENCOUNTER — Encounter (HOSPITAL_COMMUNITY): Payer: Self-pay | Admitting: Physical Therapy

## 2022-05-17 ENCOUNTER — Ambulatory Visit (HOSPITAL_COMMUNITY): Payer: Medicaid Other | Attending: Orthopedic Surgery | Admitting: Physical Therapy

## 2022-05-17 DIAGNOSIS — G8929 Other chronic pain: Secondary | ICD-10-CM | POA: Diagnosis not present

## 2022-05-17 DIAGNOSIS — R29898 Other symptoms and signs involving the musculoskeletal system: Secondary | ICD-10-CM | POA: Insufficient documentation

## 2022-05-17 DIAGNOSIS — R2689 Other abnormalities of gait and mobility: Secondary | ICD-10-CM | POA: Insufficient documentation

## 2022-05-17 DIAGNOSIS — M6281 Muscle weakness (generalized): Secondary | ICD-10-CM | POA: Insufficient documentation

## 2022-05-17 DIAGNOSIS — M25562 Pain in left knee: Secondary | ICD-10-CM | POA: Diagnosis not present

## 2022-05-17 NOTE — Therapy (Addendum)
OUTPATIENT PHYSICAL THERAPY LOWER EXTREMITY EVALUATION   Patient Name: Andrea Hartman MRN: [id number] DOB:February 18, 2001, 21 y.o., female Today's Date: 05/17/2022   PT End of Session - 05/17/22 0745     Visit Number 1    Number of Visits 6    Date for PT Re-Evaluation 06/28/22    Authorization Type Medicaid [employer]    Authorization Time Period 6 visits requested - check auth    Authorization - Visit Number 1    Authorization - Number of Visits 1    PT Start Time 0745    PT Stop Time 0820    PT Time Calculation (min) 35 min    Activity Tolerance Patient tolerated treatment well    Behavior During Therapy WFL for tasks assessed/performed             Past Medical History:  Diagnosis Date   Asthma    History reviewed. No pertinent surgical history. 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   Screening examination for STD (sexually transmitted disease) 05/21/2021   Vaginal discharge 05/21/2021   Pelvic pain 05/21/2021   Anxiety and depression 02/17/2021   Encounter for surveillance of contraceptive pills 02/17/2021   Moderate episode of recurrent major depressive disorder (HCC) 11/19/2020   Menstrual cramps 11/19/2020   Encounter for initial prescription of contraceptive pills 09/24/2020   Irregular periods 09/24/2020   Negative pregnancy test 09/24/2020   Depression 11/30/2018   Elevated hemoglobin A1c 11/30/2018   History of PCOS 11/30/2018   Pregnancy examination or test, negative result 11/30/2018   Asthma 09/18/2015   PCOS (polycystic ovarian syndrome) 09/18/2015   Acanthosis nigricans 08/16/2015   Morbid obesity (HCC) 08/16/2015   Weight gain 08/16/2015   Menorrhagia with irregular cycle 08/16/2015   Dysmenorrhea 08/16/2015   Acne 08/07/2013    PCP: Gilmore Laroche FNP  REFERRING PROVIDER: Vickki Hearing, MD   REFERRING DIAG:  [phone] (ICD-10-CM) - Chronic pain of left knee   THERAPY DIAG:  Left knee pain, unspecified chronicity  Muscle weakness (generalized)  Other abnormalities of gait and mobility  Other symptoms and signs involving the musculoskeletal system  Rationale for Evaluation and Treatment Rehabilitation  ONSET DATE: Summer 2022  SUBJECTIVE:   SUBJECTIVE STATEMENT: Pain began after hurting herself last year on a trampoline. It pops in and out of place. Standing, walking, at gym it pops out. Causes her to limp and is painful causes her to fall. After a few days it kind of gets better. She tried to buy a wrap but it didn't fit her leg well. Wrap helped to make her feel more confident and she didn't have her knee pop when using it.   PERTINENT HISTORY: history of patella subluxation/dislocation   PAIN:  Are you having pain? No  PRECAUTIONS: None  WEIGHT BEARING RESTRICTIONS No  FALLS:  Has patient fallen in last 6 months? 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  PLOF: Independent  PATIENT GOALS to hopefully get brace to help knee   OBJECTIVE:   DIAGNOSTIC FINDINGS: 04/09/22 Impression normal x-ray   PATIENT SURVEYS:  LEFS 70/80  COGNITION:  Overall cognitive status: Within functional limits for tasks assessed     SENSATION: [facility]  PALPATION: No tenderness throughout bilateral LE, WFL patellar glides all directions bilateral   LOWER EXTREMITY ROM:  Active ROM Right eval Left eval  Hip flexion    Hip extension    Hip abduction    Hip adduction    Hip internal rotation    Hip external rotation    Knee flexion 120 120  Knee extension 0 0  Ankle dorsiflexion    Ankle plantarflexion    Ankle inversion    Ankle eversion     (Blank rows = not  tested)  LOWER EXTREMITY MMT:  MMT Right eval Left eval  Hip flexion 5/5 5/5  Hip extension 4/5 4/5  Hip abduction 4/5 4/5  Hip adduction    Hip internal rotation    Hip external rotation    Knee flexion 5/5 5/5  Knee extension 5/5 4+/5  Ankle dorsiflexion 5/5 5/5  Ankle plantarflexion    Ankle inversion    Ankle eversion     (Blank rows = not tested)   FUNCTIONAL TESTS:  5 times sit to stand: 8.66 seconds without UE support, dynamic knee valgus bilaterally Stairs: 7 inch alternating pattern with unilateral UE support ascending, step too pattern descending with R leg due to apprehension on L Squat: unsteady with increasing depth  GAIT: Distance walked: 410 Assistive device utilized: None Level of assistance: Complete Independence Comments: 2MWT: L knee in slight flexed position during IC and stance    TODAY'S TREATMENT: 05/17/22 LAQ 10x 10 second holds LLE STS 1x 10    PATIENT EDUCATION:  Education details: Patient educated on exam findings, POC, scope of PT, HEP. Person educated: Patient Education method: Explanation, Demonstration, and Handouts Education comprehension: verbalized understanding, returned demonstration, verbal cues required, and tactile cues required    HOME EXERCISE PROGRAM: 6/5/23Access Code: [email or web address] - Seated Long Arc Quad (Mirrored)  - 3 x daily - 7 x weekly - 10 reps - 10 second holds hold - Sit to Stand with Arms Crossed  - 2 x daily - 7 x weekly - 2-3 sets - 10 reps  ASSESSMENT:  CLINICAL IMPRESSION: Patient a 21 y.o. y.o. female who was seen today for physical therapy evaluation and treatment for chronic L knee pain. Patient presents with pain limited deficits in hip and knee strength, gait, endurance, activity tolerance, and functional mobility with ADL. Patient is having to modify and restrict ADL as indicated by outcome measure score as well as subjective information and objective measures which is affecting overall participation.  Patient will benefit from skilled physical therapy in order to improve function and reduce impairment.    OBJECTIVE IMPAIRMENTS Abnormal gait, decreased activity tolerance, decreased mobility, difficulty walking, decreased strength, improper body mechanics, obesity, and pain.   ACTIVITY LIMITATIONS lifting, bending, squatting, stairs, transfers, and locomotion level  PARTICIPATION LIMITATIONS: cleaning, community activity, occupation, and exercise at gym  PERSONAL FACTORS Fitness, Time since onset of injury/illness/exacerbation, and 1-2 comorbidities: chronic knee pain, increased BMI  are also affecting patient's functional outcome.   REHAB POTENTIAL: Good  CLINICAL DECISION MAKING: Stable/uncomplicated  EVALUATION COMPLEXITY: Low   GOALS: Goals reviewed with patient? Yes  SHORT TERM GOALS: Target date: 06/07/2022  Patient will be independent with HEP in order to improve functional outcomes. Baseline:  Goal status: INITIAL  2.  Patient will report at least 25% improvement in symptoms for improved quality of life. Baseline:  Goal status: INITIAL   LONG TERM GOALS: Target date: 06/28/2022  Patient will report at least 75% improvement in symptoms for  improved quality of life. Baseline:  Goal status: INITIAL  2.  Patient will improve LEFS score by at least 5 points in order to indicate improved tolerance to activity. Baseline: 70/80 Goal status: INITIAL  3.  Patient will be able to navigate stairs with reciprocal pattern without compensation in order to demonstrate improved LE strength. Baseline: see above Goal status: INITIAL  4.  Patient will be able to ambulate at least 450 feet in in order to demonstrate improved tolerance to activity. Baseline: 410 feet Goal status: INITIAL  5.  Patient will demonstrate grade of 5/5 MMT grade in all tested musculature as evidence of improved strength to assist with stair ambulation and gait.   Baseline: see MMT Goal status:  INITIAL    PLAN: PT FREQUENCY: 1x/week  PT DURATION: 6 weeks  PLANNED INTERVENTIONS: Therapeutic exercises, Therapeutic activity, Neuromuscular re-education, Balance training, Gait training, Patient/Family education, Joint manipulation, Joint mobilization, Stair training, Orthotic/Fit training, DME instructions, Aquatic Therapy, Dry Needling, Electrical stimulation, Spinal manipulation, Spinal mobilization, Cryotherapy, Moist heat, Compression bandaging, scar mobilization, Splintting, Taping, Traction, Ultrasound, Ionotophoresis 4mg /ml Dexamethasone, and Manual therapy   PLAN FOR NEXT SESSION: f/u with HEP, quad and hip strength   Caretha Rumbaugh, PT 05/17/2022, 8:24 AM

## 2022-05-24 ENCOUNTER — Ambulatory Visit (HOSPITAL_COMMUNITY): Payer: Medicaid Other | Admitting: Physical Therapy

## 2022-05-24 DIAGNOSIS — M6281 Muscle weakness (generalized): Secondary | ICD-10-CM | POA: Diagnosis not present

## 2022-05-24 DIAGNOSIS — R29898 Other symptoms and signs involving the musculoskeletal system: Secondary | ICD-10-CM

## 2022-05-24 DIAGNOSIS — M25562 Pain in left knee: Secondary | ICD-10-CM

## 2022-05-24 DIAGNOSIS — R2689 Other abnormalities of gait and mobility: Secondary | ICD-10-CM | POA: Diagnosis not present

## 2022-05-24 DIAGNOSIS — G8929 Other chronic pain: Secondary | ICD-10-CM | POA: Diagnosis not present

## 2022-05-24 NOTE — Therapy (Signed)
OUTPATIENT PHYSICAL THERAPY TREATMENT   Patient Name: Andrea Hartman MRN: [id number] DOB:2001-10-27, 21 y.o., female Today's Date: 05/24/2022   PT End of Session - 05/24/22 1007     Visit Number 2    Number of Visits 6    Date for PT Re-Evaluation 06/28/22    Authorization Type Medicaid [employer]    Authorization Time Period 6 visits requested - check auth    Authorization - Visit Number 2    Authorization - Number of Visits 6    PT Start Time 1004    PT Stop Time 1040    PT Time Calculation (min) 36 min    Activity Tolerance Patient tolerated treatment well    Behavior During Therapy WFL for tasks assessed/performed             Past Medical History:  Diagnosis Date   Asthma    No past surgical history on file. 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   Screening examination for STD (sexually transmitted disease) 05/21/2021   Vaginal discharge 05/21/2021   Pelvic pain 05/21/2021   Anxiety and depression 02/17/2021   Encounter for surveillance of contraceptive pills 02/17/2021   Moderate episode of recurrent major depressive disorder ([place]) 11/19/2020   Menstrual cramps 11/19/2020   Encounter for initial prescription of contraceptive pills 09/24/2020   Irregular periods 09/24/2020   Negative pregnancy test 09/24/2020   Depression 11/30/2018   Elevated hemoglobin A1c 11/30/2018   History of PCOS 11/30/2018   Pregnancy examination or test, negative result 11/30/2018   Asthma 09/18/2015   PCOS (polycystic ovarian syndrome) 09/18/2015   Acanthosis nigricans 08/16/2015   Morbid obesity ([place]) 08/16/2015   Weight gain 08/16/2015   Menorrhagia with irregular cycle 08/16/2015   Dysmenorrhea 08/16/2015   Acne 08/07/2013    PCP: Alvira Monday FNP  REFERRING PROVIDER: Carole Civil, MD   REFERRING DIAG: [phone] (ICD-10-CM) - Chronic pain  of left knee   THERAPY DIAG:  Left knee pain, unspecified chronicity  Muscle weakness (generalized)  Other abnormalities of gait and mobility  Other symptoms and signs involving the musculoskeletal system  Rationale for Evaluation and Treatment Rehabilitation  ONSET DATE: Summer 2022  SUBJECTIVE:   SUBJECTIVE STATEMENT: Pt states her knee buckled over the weekend while she was dancing, pain was a 4/10 when this happened.  Was able to catch herself and did not fall.  Currently without pain and doing the 2 exercises given for HEP.  PERTINENT HISTORY: history of patella subluxation/dislocation   PAIN:  Are you having pain? No and Yes: NPRS scale: 0/10 Pain location: lt knee Pain description: sharp initially with dull ache afterward Aggravating factors: dancing, walking a lot, certain movements/position Relieving factors: rest, ice  PRECAUTIONS: None  WEIGHT BEARING RESTRICTIONS No  FALLS:  Has patient fallen in last 6 months? 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  PLOF: [place] to hopefully get brace to help knee   OBJECTIVE:   DIAGNOSTIC FINDINGS: 04/09/22 Impression normal x-ray   PATIENT SURVEYS:  LEFS 70/80  COGNITION:  Overall cognitive status: Within functional limits for tasks assessed     SENSATION: WFL    PALPATION: No tenderness throughout bilateral LE, [facility]  patellar glides all directions bilateral   LOWER EXTREMITY ROM:  Active ROM Right eval Left eval  Hip flexion    Hip extension    Hip abduction    Hip adduction    Hip internal rotation    Hip external rotation    Knee flexion 120 120  Knee extension 0 0  Ankle dorsiflexion    Ankle plantarflexion    Ankle inversion    Ankle eversion     (Blank rows = not  tested)  LOWER EXTREMITY MMT:  MMT Right eval Left eval  Hip flexion 5/5 5/5  Hip extension 4/5 4/5  Hip abduction 4/5 4/5  Hip adduction    Hip internal rotation    Hip external rotation    Knee flexion 5/5 5/5  Knee extension 5/5 4+/5  Ankle dorsiflexion 5/5 5/5  Ankle plantarflexion    Ankle inversion    Ankle eversion     (Blank rows = not tested)   FUNCTIONAL TESTS:  5 times sit to stand: 8.66 seconds without UE support, dynamic knee valgus bilaterally Stairs: 7 inch alternating pattern with unilateral UE support ascending, step too pattern descending with R leg due to apprehension on L Squat: unsteady with increasing depth  GAIT: Distance walked: 410 Assistive device utilized: None Level of assistance: Complete Independence Comments: 2MWT: L knee in slight flexed position during IC and stance    TODAY'S TREATMENT: 05/24/22 Seated: LAQ 10X10" holds Lt LE  STS 10X no UE from standard height Supine: bridge 10X2 sets  SLR toe in neutral 10X each  SLR toe in ER 10X each  SAQ toe in neutral 10X10"  SAQ toe in ER 10X10" Sidelying: hip abduction 10X2 sets each LE Prone: hamstring curls 10X2 each  Hip extension in good form 10X2 each  05/17/22 LAQ 10x 10 second holds LLE STS 1x 10    PATIENT EDUCATION:  Education details: 05/24/22:  review of goals, HEP and POC moving forward;  eval: Patient educated on exam findings, POC, scope of PT, HEP. Person educated: Patient Education method: Explanation, Demonstration, and Handouts Education comprehension: verbalized understanding, returned demonstration, verbal cues required, and tactile cues required    HOME EXERCISE PROGRAM: 05/24/22 Supine bridge, SLR in neutral, SLR in ER, side lying hip abduction 2X10 2X day  6/5/23Access Code: [email or web address] - Seated Long Arc Quad (Mirrored)  - 3 x daily - 7 x weekly - 10 reps - 10 second holds hold - Sit to Stand with Arms Crossed  - 2 x daily - 7 x weekly - 2-3 sets - 10  reps  ASSESSMENT:  CLINICAL IMPRESSION: Reviewed goals, HEP and POC moving forward.  Pt able to recall HEP and complete without cues, however pt with difficulty keeping count of repetitions.   Progressed LE strength, specifically hip and knee.  Progressed therex with addition of VMO activation.  Pt reported this being most challenging.  Updated HEP to include most of added therex this session.  Pt without any c/o pain questions or concerns at end of session.   Patient will benefit from skilled physical therapy in order to improve function and reduce impairment.    OBJECTIVE IMPAIRMENTS Abnormal gait, decreased activity tolerance, decreased mobility, difficulty walking, decreased strength, improper body mechanics, obesity, and pain.   ACTIVITY LIMITATIONS lifting, bending, squatting, stairs, transfers, and locomotion level  PARTICIPATION LIMITATIONS: cleaning, community activity, occupation, and exercise at gym  [place], Time since onset of injury/illness/exacerbation, and 1-2 comorbidities: chronic knee pain, increased BMI  are also affecting patient's functional outcome.   REHAB POTENTIAL: Good  CLINICAL DECISION MAKING: Stable/uncomplicated  EVALUATION COMPLEXITY: Low   GOALS: Goals reviewed with patient? Yes  SHORT TERM GOALS: Target date: 06/07/2022  Patient will be independent with HEP in order to improve functional outcomes. Baseline:  Goal status: IN PROGRESS  2.  Patient will report at least 25% improvement in symptoms for improved quality of life. Baseline:  Goal status: IN PROGRESS   LONG TERM GOALS: Target date: 06/28/2022  Patient will report at least 75% improvement in symptoms for improved quality of life. Baseline:  Goal status: IN PROGRESS  2.  Patient will improve LEFS score by at least 5 points in order to indicate improved tolerance to activity. Baseline: 70/80 Goal status: IN PROGRESS  3.  Patient will be able to navigate stairs with  reciprocal pattern without compensation in order to demonstrate improved LE strength. Baseline: see above Goal status: IN PROGRESS  4.  Patient will be able to ambulate at least 450 feet in 2MWT in order to demonstrate improved tolerance to activity. Baseline: 410 feet Goal status: IN PROGRESS  5.  Patient will demonstrate grade of 5/5 MMT grade in all tested musculature as evidence of improved strength to assist with stair ambulation and gait.   Baseline: see MMT Goal status: IN PROGRESS    PLAN: PT FREQUENCY: 1x/week  PT DURATION: 6 weeks  PLANNED INTERVENTIONS: Therapeutic exercises, Therapeutic activity, Neuromuscular re-education, Balance training, Gait training, Patient/Family education, Joint manipulation, Joint mobilization, Stair training, Orthotic/Fit training, DME instructions, Aquatic Therapy, Dry Needling, Electrical stimulation, Spinal manipulation, Spinal mobilization, Cryotherapy, Moist heat, Compression bandaging, scar mobilization, Splintting, Taping, Traction, Ultrasound, Ionotophoresis 4mg /ml Dexamethasone, and Manual therapy   PLAN FOR NEXT SESSION: f/u with HEP, quad and hip strength  Teena Irani, PTA/CLT [facility] Ph: [phone]  Teena Irani, PTA 05/24/2022, 10:58 AM

## 2022-06-02 ENCOUNTER — Encounter (HOSPITAL_COMMUNITY): Payer: Medicaid Other | Admitting: Physical Therapy

## 2022-06-08 ENCOUNTER — Ambulatory Visit: Payer: Medicaid Other | Admitting: Nurse Practitioner

## 2022-06-08 ENCOUNTER — Ambulatory Visit (HOSPITAL_COMMUNITY): Payer: Medicaid Other | Admitting: Physical Therapy

## 2022-06-08 DIAGNOSIS — M6281 Muscle weakness (generalized): Secondary | ICD-10-CM

## 2022-06-08 DIAGNOSIS — R2689 Other abnormalities of gait and mobility: Secondary | ICD-10-CM | POA: Diagnosis not present

## 2022-06-08 DIAGNOSIS — M25562 Pain in left knee: Secondary | ICD-10-CM | POA: Diagnosis not present

## 2022-06-08 DIAGNOSIS — G8929 Other chronic pain: Secondary | ICD-10-CM | POA: Diagnosis not present

## 2022-06-08 DIAGNOSIS — R29898 Other symptoms and signs involving the musculoskeletal system: Secondary | ICD-10-CM | POA: Diagnosis not present

## 2022-06-08 NOTE — Therapy (Signed)
OUTPATIENT PHYSICAL THERAPY TREATMENT   Patient Name: Andrea Hartman MRN: [id number] DOB:02/18/2001, 21 y.o., female Today's Date: 06/08/2022   PT End of Session - 06/08/22 0837     Visit Number 3    Number of Visits 6    Date for PT Re-Evaluation 06/28/22    Authorization Type Medicaid [employer]    Authorization Time Period 6 visits approved 05/17/22-07/15/22    Authorization - Visit Number 3    Authorization - Number of Visits 6    PT Start Time [phone]    PT Stop Time 0913    PT Time Calculation (min) 40 min    Activity Tolerance Patient tolerated treatment well    Behavior During Therapy [facility] for tasks assessed/performed             Past Medical History:  Diagnosis Date   Asthma    No past surgical history on file. 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   Screening examination for STD (sexually transmitted disease) 05/21/2021   Vaginal discharge 05/21/2021   Pelvic pain 05/21/2021   Anxiety and depression 02/17/2021   Encounter for surveillance of contraceptive pills 02/17/2021   Moderate episode of recurrent major depressive disorder ([facility]) 11/19/2020   Menstrual cramps 11/19/2020   Encounter for initial prescription of contraceptive pills 09/24/2020   Irregular periods 09/24/2020   Negative pregnancy test 09/24/2020   Depression 11/30/2018   Elevated hemoglobin A1c 11/30/2018   History of PCOS 11/30/2018   Pregnancy examination or test, negative result 11/30/2018   Asthma 09/18/2015   PCOS (polycystic ovarian syndrome) 09/18/2015   Acanthosis nigricans 08/16/2015   Morbid obesity ([facility]) 08/16/2015   Weight gain 08/16/2015   Menorrhagia with irregular cycle 08/16/2015   Dysmenorrhea 08/16/2015   Acne 08/07/2013    PCP: Gilmore Laroche FNP  REFERRING PROVIDER: Vickki Hearing, MD   REFERRING DIAG: [phone] (ICD-10-CM) - Chronic pain  of left knee   THERAPY DIAG:  Left knee pain, unspecified chronicity  Muscle weakness (generalized)  Other abnormalities of gait and mobility  Other symptoms and signs involving the musculoskeletal system  Rationale for Evaluation and Treatment Rehabilitation  ONSET DATE: Summer 2022  SUBJECTIVE:   SUBJECTIVE STATEMENT: Pt states her knee has been feeling stiffer and "popping out" more often.  States when it does pop out she's able to "shake it off" easier and place weight back on it where she wasn't able to do that before.  Currently without pain and doing her HEP.  Reports the most her pain has gotten up to is 6/10.  PERTINENT HISTORY: history of patella subluxation/dislocation   PAIN:  Are you having pain? No and Yes: NPRS scale: 0/10 Pain location: lt knee Pain description: sharp initially with dull ache afterward Aggravating factors: dancing, walking a lot, certain movements/position Relieving factors: rest, ice  PRECAUTIONS: None  WEIGHT BEARING RESTRICTIONS No  FALLS:  Has patient fallen in last 6 months? 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  PLOF: Independent  PATIENT GOALS to hopefully get brace to help knee   OBJECTIVE:   DIAGNOSTIC FINDINGS: 04/09/22 Impression normal x-ray   PATIENT SURVEYS:  LEFS 70/80  COGNITION:  Overall cognitive status: Within functional limits for  tasks assessed     SENSATION: WFL    PALPATION: No tenderness throughout bilateral LE, WFL patellar glides all directions bilateral   LOWER EXTREMITY ROM:  Active ROM Right eval Left eval  Hip flexion    Hip extension    Hip abduction    Hip adduction    Hip internal rotation    Hip external rotation    Knee flexion 120 120  Knee extension 0 0  Ankle dorsiflexion     Ankle plantarflexion    Ankle inversion    Ankle eversion     (Blank rows = not tested)  LOWER EXTREMITY MMT:  MMT Right eval Left eval  Hip flexion 5/5 5/5  Hip extension 4/5 4/5  Hip abduction 4/5 4/5  Hip adduction    Hip internal rotation    Hip external rotation    Knee flexion 5/5 5/5  Knee extension 5/5 4+/5  Ankle dorsiflexion 5/5 5/5  Ankle plantarflexion    Ankle inversion    Ankle eversion     (Blank rows = not tested)   FUNCTIONAL TESTS:  5 times sit to stand: 8.66 seconds without UE support, dynamic knee valgus bilaterally Stairs: 7 inch alternating pattern with unilateral UE support ascending, step too pattern descending with R leg due to apprehension on L Squat: unsteady with increasing depth  GAIT: Distance walked: 410 Assistive device utilized: None Level of assistance: Complete Independence Comments: : L knee in slight flexed position during IC and stance    TODAY'S TREATMENT: 06/08/22 Seated:  Lt LE LAQ 10X10" holds  STS 10X no UE from standard chair Supine: Bridge 2X10  Single leg bridge, Rt on green physioball Lt only 2X10  SLR toe in neutral 10X each  SLR toe in ER 10X each  SAQ toe in neutral 10X10"  SAQ toe in ER 10X10" Sidelying: hip abduction 2X10 bil LE Prone:  Hamstring curls 2X10 each  Hip extension 2X10 each STanding: Heelraises 20X on incline  Hip abduction 10X each  Hip extension 10X each  Lunges onto 4" box 10X each  Vectors   05/24/22 Seated: LAQ 10X10" holds Lt LE  STS 10X no UE from standard height Supine: bridge 10X2 sets  SLR toe in neutral 10X each  SLR toe in ER 10X each  SAQ toe in neutral 10X10"  SAQ toe in ER 10X10" Sidelying: hip abduction 10X2 sets each LE Prone: hamstring curls 10X2 each  Hip extension in good form 10X2 each  05/17/22 LAQ 10x 10 second holds LLE STS 1x 10    PATIENT EDUCATION:  Education details: 05/24/22:  review of goals, HEP and POC moving forward;  eval: Patient educated on  exam findings, POC, scope of PT, HEP. Person educated: Patient Education method: Explanation, Demonstration, and Handouts Education comprehension: verbalized understanding, returned demonstration, verbal cues required, and tactile cues required    HOME EXERCISE PROGRAM: 05/24/22 Supine bridge, SLR in neutral, SLR in ER, side lying hip abduction 2X10 2X day  6/5/23Access Code: [email or web address] - Seated Long Arc Quad (Mirrored)  - 3 x daily - 7 x weekly - 10 reps - 10 second holds hold - Sit to Stand with Arms Crossed  - 2 x daily - 7 x weekly - 2-3 sets - 10 reps  ASSESSMENT:  CLINICAL IMPRESSION: Continued with established therex focusing mainly on Lt LE strength/stability. Improved ability to remain count of repetitions today.Continued with VMO activation with challenge.  Progressed to single leg bridge, however required assistance of Green physio  ball under Rt LE as unable to do without AAROM. Progressed to standing exercises with some discomfort due to weakness when standing on Lt to complete exercises on Rt.  Pt without any c/o pain questions or concerns at end of session.   Patient will benefit from skilled physical therapy in order to improve function and reduce impairment.    OBJECTIVE IMPAIRMENTS Abnormal gait, decreased activity tolerance, decreased mobility, difficulty walking, decreased strength, improper body mechanics, obesity, and pain.   ACTIVITY LIMITATIONS lifting, bending, squatting, stairs, transfers, and locomotion level  PARTICIPATION LIMITATIONS: cleaning, community activity, occupation, and exercise at gym  PERSONAL FACTORS [place], Time since onset of injury/illness/exacerbation, and 1-2 comorbidities: chronic knee pain, increased BMI  are also affecting patient's functional outcome.   REHAB POTENTIAL: Good  CLINICAL DECISION MAKING: Stable/uncomplicated  EVALUATION COMPLEXITY: Low   GOALS: Goals reviewed with patient? Yes  SHORT TERM GOALS: Target date:  06/07/2022  Patient will be independent with HEP in order to improve functional outcomes. Baseline:  Goal status: IN PROGRESS  2.  Patient will report at least 25% improvement in symptoms for improved quality of life. Baseline:  Goal status: IN PROGRESS   LONG TERM GOALS: Target date: 06/28/2022  Patient will report at least 75% improvement in symptoms for improved quality of life. Baseline:  Goal status: IN PROGRESS  2.  Patient will improve LEFS score by at least 5 points in order to indicate improved tolerance to activity. Baseline: 70/80 Goal status: IN PROGRESS  3.  Patient will be able to navigate stairs with reciprocal pattern without compensation in order to demonstrate improved LE strength. Baseline: see above Goal status: IN PROGRESS  4.  Patient will be able to ambulate at least 450 feet in in order to demonstrate improved tolerance to activity. Baseline: 410 feet Goal status: IN PROGRESS  5.  Patient will demonstrate grade of 5/5 MMT grade in all tested musculature as evidence of improved strength to assist with stair ambulation and gait.   Baseline: see MMT Goal status: IN PROGRESS    PLAN: PT FREQUENCY: 1x/week  PT DURATION: 6 weeks  PLANNED INTERVENTIONS: Therapeutic exercises, Therapeutic activity, Neuromuscular re-education, Balance training, Gait training, Patient/Family education, Joint manipulation, Joint mobilization, Stair training, Orthotic/Fit training, DME instructions, Aquatic Therapy, Dry Needling, Electrical stimulation, Spinal manipulation, Spinal mobilization, Cryotherapy, Moist heat, Compression bandaging, scar mobilization, Splintting, Taping, Traction, Ultrasound, Ionotophoresis 4mg /ml Dexamethasone, and Manual therapy   PLAN FOR NEXT SESSION:  continue to progress quad and hip strength  Lurena Nida, PTA/CLT [facility] Ph: [phone]  Lurena Nida, PTA 06/08/2022, 9:13 AM

## 2022-06-16 ENCOUNTER — Encounter (HOSPITAL_COMMUNITY): Payer: Medicaid Other | Admitting: Physical Therapy

## 2022-06-22 ENCOUNTER — Encounter: Payer: Self-pay | Admitting: Family Medicine

## 2022-06-22 ENCOUNTER — Ambulatory Visit (INDEPENDENT_AMBULATORY_CARE_PROVIDER_SITE_OTHER): Payer: Medicaid Other | Admitting: Family Medicine

## 2022-06-22 VITALS — BP 124/82 | HR 91 | Ht 66.0 in | Wt 338.8 lb

## 2022-06-22 DIAGNOSIS — Z23 Encounter for immunization: Secondary | ICD-10-CM | POA: Diagnosis not present

## 2022-06-22 DIAGNOSIS — Z3200 Encounter for pregnancy test, result unknown: Secondary | ICD-10-CM | POA: Insufficient documentation

## 2022-06-22 DIAGNOSIS — Z3202 Encounter for pregnancy test, result negative: Secondary | ICD-10-CM

## 2022-06-22 DIAGNOSIS — Z113 Encounter for screening for infections with a predominantly sexual mode of transmission: Secondary | ICD-10-CM | POA: Diagnosis not present

## 2022-06-22 DIAGNOSIS — F419 Anxiety disorder, unspecified: Secondary | ICD-10-CM | POA: Diagnosis not present

## 2022-06-22 DIAGNOSIS — F32A Depression, unspecified: Secondary | ICD-10-CM

## 2022-06-22 LAB — POCT URINE PREGNANCY: Preg Test, Ur: NEGATIVE

## 2022-06-22 MED ORDER — FLUOXETINE HCL 20 MG PO TABS: 20.0000 mg | ORAL_TABLET | Freq: Every day | ORAL | 3 refills | Status: DC

## 2022-06-22 NOTE — Patient Instructions (Signed)
I appreciate the opportunity to provide care to you today!    Follow up:  1 months   Please pick up your new prescription at the pharmacy  Screening: pregnancy test   Thank you for getting your HPV vaccine   Please continue to a heart-healthy diet and increase your physical activities. Try to exercise for at least three times a week.      It was a pleasure to see you and I look forward to continuing to work together on your health and well-being. Please do not hesitate to call the office if you need care or have questions about your care.   Have a wonderful day and week. With Gratitude, Gilmore Laroche MSN, FNP-BC

## 2022-06-22 NOTE — Assessment & Plan Note (Signed)
Symptom unchanged on Prozac 10 mg with worsening depression Denies SI and HI Denies suicidal thoughts and behaviors Will increase Prozac dose to 20mg  and reassess therapy in a 1 month Will consider changing to SNRIs if treatment failure on SSRIs

## 2022-06-22 NOTE — Assessment & Plan Note (Addendum)
Last menstrual cycle was in April Notes to not having a menstrual cycle for 2 months Had a negative pregnancy test in June of 2023 The last coital act was last week She notes to be currently on progestin-only pills Negative pregnancy test in clinic

## 2022-06-22 NOTE — Progress Notes (Signed)
Established Patient Office Visit  Subjective:  Patient ID: Andrea Hartman, female    DOB: 2001-04-06  Age: 21 y.o. MRN: [id number]  CC:  Chief Complaint  Patient presents with   Anxiety    Would like to discuss anxiety concerns, has got worse with depression associated with this.    Immunizations    [place] late [place] and needs to be up to date on vaccines. Due for hpv and chlamydia testing also, needs a pregnancy test today.     HPI Andrea Hartman is a 21 y.o. female with past medical history of anxiety and depression presents for f/u of chronic medical conditions. Anxiety: symptom unchanged on Prozac 10 mg with worsening depression. Denies SI and HI.   Possibly pregnancy: last menstrual cycle was in April. Notes to not having a menstrual cycle for 2 months. Had a negative pregnancy test in June of 2023. The last coital act was last week. She notes to be currently on progestin-only pills.   Vaccinations: she reports an upcoming trip to [place] in february of 2024. She wants her vaccinations completed to travel.    Fist dose of HPV vaccine given today Past Medical History:  Diagnosis Date   Asthma     History reviewed. No pertinent surgical history.  Family History  Problem Relation Age of Onset   Diabetes Mother    Hypertension Mother    Diabetes Maternal Grandmother    Hypertension Maternal Grandmother    Other Maternal Grandmother        vertigo   Healthy Sister    Healthy Sister     Social History   Socioeconomic History   Marital status: Single    Spouse name: Not on file   Number of children: Not on file   Years of education: Not on file   Highest education level: Not on file  Occupational History   Not on file  Tobacco Use   Smoking status: Former    Types: E-cigarettes   Smokeless tobacco: Never  Vaping Use   Vaping Use: Former  Substance and Sexual Activity   Alcohol use: Not Currently   Drug use:  Not Currently   Sexual activity: Yes    Birth control/protection: Pill  Other Topics Concern   Not on file  Social History Narrative   Not on file   Social Determinants of Health   Financial Resource Strain: High Risk (09/24/2020)   Overall Financial Resource Strain (CARDIA)    Difficulty of Paying Living Expenses: Very hard  Food Insecurity: Food Insecurity Present (11/05/2020)   Hunger Vital Sign    Worried About [place] in the Last Year: Often true    Ran Out of Food in the Last Year: Often true  Transportation Needs: Unmet Transportation Needs (11/05/2020)   PRAPARE - Hydrologist (Medical): Yes    Lack of Transportation (Non-Medical): Yes  Physical Activity: Insufficiently Active (09/24/2020)   Exercise Vital Sign    Days of Exercise per Week: 1 day    Minutes of Exercise per Session: 10 min  Stress: Stress Concern Present (09/24/2020)   [place]    Feeling of Stress : Very much  Social Connections: Moderately Isolated (09/24/2020)   Social Connection and Isolation Panel [NHANES]    Frequency of Communication with Friends and Family: More than three times a week    Frequency of Social Gatherings with  Friends and Family: Twice a week    Attends Religious Services: Never    Marine scientist or Organizations: No    Attends Archivist Meetings: Never    Marital Status: Living with partner  Intimate Partner Violence: Not At Risk (09/24/2020)   Humiliation, Afraid, Rape, and Kick questionnaire    Fear of Current or Ex-Partner: No    Emotionally Abused: No    Physically Abused: No    Sexually Abused: No    Outpatient Medications Prior to Visit  Medication Sig Dispense Refill   ibuprofen (ADVIL) 800 MG tablet Take 1 tablet (800 mg total) by mouth every 8 (eight) hours as needed. 60 tablet 3   Magnesium Oxide 400 MG CAPS Take 1 capsule (400 mg total)  by mouth daily. 90 capsule 1   Norethindrone Acetate-Ethinyl Estradiol (LOESTRIN) 1.5-30 MG-MCG tablet Take 1 tablet by mouth daily. 28 tablet 11   FLUoxetine (PROZAC) 10 MG tablet Take 1 tablet (10 mg total) by mouth daily. 90 tablet 3   No facility-administered medications prior to visit.    Allergies  Allergen Reactions   Latex Rash    ROS Review of Systems  Constitutional:  Negative for fatigue and fever.  Respiratory:  Negative for chest tightness, shortness of breath and wheezing.   Cardiovascular:  Negative for chest pain and palpitations.  Gastrointestinal:  Negative for diarrhea, nausea and vomiting.  Neurological:  Negative for dizziness, tremors, weakness, numbness and headaches.  Psychiatric/Behavioral:  Negative for behavioral problems, self-injury and suicidal ideas.       Objective:    Physical Exam HENT:     Head: Normocephalic.     Mouth/Throat:     Mouth: Mucous membranes are moist.  Cardiovascular:     Rate and Rhythm: Normal rate and regular rhythm.     Pulses: Normal pulses.     Heart sounds: Normal heart sounds.  Pulmonary:     Effort: Pulmonary effort is normal.     Breath sounds: Normal breath sounds.  Neurological:     Mental Status: She is alert and oriented to person, place, and time.     BP 124/82   Pulse 91   Ht '5\' 6"'  (1.676 m)   Wt (!) 338 lb 12.8 oz (153.7 kg)   LMP 05/03/2022   SpO2 98%   BMI 54.68 kg/m  Wt Readings from Last 3 Encounters:  06/22/22 (!) 338 lb 12.8 oz (153.7 kg)  03/24/22 (!) 327 lb 12.8 oz (148.7 kg)  01/04/22 (!) 327 lb (148.3 kg) (>99 %, Z= 3.00)*   * Growth percentiles are based on CDC (Girls, 2-20 Years) data.    Lab Results  Component Value Date   TSH 3.120 03/24/2022   Lab Results  Component Value Date   WBC 9.5 03/24/2022   HGB 13.1 03/24/2022   HCT 40.4 03/24/2022   MCV 79 03/24/2022   PLT 425 03/24/2022   Lab Results  Component Value Date   NA 137 03/24/2022   K 4.9 03/24/2022   CO2 22  03/24/2022   GLUCOSE 90 03/24/2022   BUN 12 03/24/2022   CREATININE 0.81 03/24/2022   BILITOT 0.2 03/24/2022   ALKPHOS 99 03/24/2022   AST 10 03/24/2022   ALT 22 03/24/2022   PROT 7.2 03/24/2022   ALBUMIN 4.1 03/24/2022   CALCIUM 9.8 03/24/2022   ANIONGAP 10 08/30/2020   EGFR 107 03/24/2022   Lab Results  Component Value Date   CHOL 211 (H) 03/24/2022  Lab Results  Component Value Date   HDL 54 03/24/2022   Lab Results  Component Value Date   LDLCALC 135 (H) 03/24/2022   Lab Results  Component Value Date   TRIG 122 03/24/2022   Lab Results  Component Value Date   CHOLHDL 3.9 03/24/2022   Lab Results  Component Value Date   HGBA1C 5.7 (H) 03/24/2022      Assessment & Plan:   Problem List Items Addressed This Visit       Other   Anxiety and depression - Primary    Symptom unchanged on Prozac 10 mg with worsening depression Denies SI and HI Denies suicidal thoughts and behaviors Will increase Prozac dose to 75m and reassess therapy in a 1 month Will consider changing to SNRIs if treatment failure on SSRIs         Relevant Medications   FLUoxetine (PROZAC) 20 MG tablet   Screening examination for STD (sexually transmitted disease)   Relevant Orders   Chlamydia/GC NAA, Confirmation   HIV antibody (with reflex)   Encounter for pregnancy test    Last menstrual cycle was in April Notes to not having a menstrual cycle for 2 months Had a negative pregnancy test in June of 2023 The last coital act was last week She notes to be currently on progestin-only pills Negative pregnancy test in clinic      Other Visit Diagnoses     Immunization due       Relevant Orders   HPV 9-valent vaccine,Recombinat       Meds ordered this encounter  Medications   FLUoxetine (PROZAC) 20 MG tablet    Sig: Take 1 tablet (20 mg total) by mouth daily.    Dispense:  90 tablet    Refill:  3    Follow-up: Return in about 1 month (around 07/23/2022) for anxiety and  depression.    GAlvira Monday FNP

## 2022-06-23 ENCOUNTER — Telehealth (HOSPITAL_COMMUNITY): Payer: Self-pay | Admitting: Physical Therapy

## 2022-06-23 ENCOUNTER — Encounter (HOSPITAL_COMMUNITY): Payer: Medicaid Other | Admitting: Physical Therapy

## 2022-06-23 LAB — HIV ANTIBODY (ROUTINE TESTING W REFLEX): HIV Screen 4th Generation wRfx: NONREACTIVE

## 2022-06-23 NOTE — Telephone Encounter (Signed)
Pt did not show for scheduled appointment.  Called and left message regarding missed appointment and reminder for next appointment.   Lurena Nida, PTA/CLT [facility] [place] Ph: [phone]

## 2022-06-24 LAB — CHLAMYDIA/GC NAA, CONFIRMATION
Chlamydia trachomatis, NAA: NEGATIVE
Neisseria gonorrhoeae, NAA: NEGATIVE

## 2022-06-29 ENCOUNTER — Ambulatory Visit (HOSPITAL_COMMUNITY): Payer: Medicaid Other | Attending: Orthopedic Surgery | Admitting: Physical Therapy

## 2022-06-29 ENCOUNTER — Encounter (HOSPITAL_COMMUNITY): Payer: Self-pay | Admitting: Physical Therapy

## 2022-06-29 DIAGNOSIS — M6281 Muscle weakness (generalized): Secondary | ICD-10-CM | POA: Diagnosis not present

## 2022-06-29 DIAGNOSIS — M25562 Pain in left knee: Secondary | ICD-10-CM | POA: Insufficient documentation

## 2022-06-29 DIAGNOSIS — R2689 Other abnormalities of gait and mobility: Secondary | ICD-10-CM | POA: Insufficient documentation

## 2022-06-29 DIAGNOSIS — R29898 Other symptoms and signs involving the musculoskeletal system: Secondary | ICD-10-CM | POA: Insufficient documentation

## 2022-06-29 NOTE — Therapy (Addendum)
OUTPATIENT PHYSICAL THERAPY TREATMENT   Patient Name: Andrea Hartman MRN: [id number] DOB:2001/07/06, 21 y.o., female Today's Date: 06/29/2022  PHYSICAL THERAPY DISCHARGE SUMMARY  Visits from Start of Care: 4  Current functional level related to goals / functional outcomes: See below   Remaining deficits: See below   Education / Equipment: See below   Patient agrees to discharge. Patient goals were partially met. Patient is being discharged due to meeting the stated rehab goals.    PT End of Session - 06/29/22 0908     Visit Number 4    Number of Visits 6    Date for PT Re-Evaluation 06/28/22    Authorization Type Medicaid [employer]    Authorization Time Period 6 visits approved 05/17/22-07/15/22    Authorization - Visit Number 4    Authorization - Number of Visits 6    PT Start Time 0906    PT Stop Time 0937    PT Time Calculation (min) 31 min    Activity Tolerance Patient tolerated treatment well    Behavior During Therapy [facility] for tasks assessed/performed             Past Medical History:  Diagnosis Date   Asthma    History reviewed. No pertinent surgical history. 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   Screening examination for STD (sexually transmitted disease) 05/21/2021   Vaginal discharge 05/21/2021   Pelvic pain 05/21/2021   Anxiety and depression 02/17/2021   Encounter for surveillance of contraceptive pills 02/17/2021   Moderate episode of recurrent major depressive disorder ([place]) 11/19/2020   Menstrual cramps 11/19/2020   Encounter for initial prescription of contraceptive pills 09/24/2020   Irregular periods 09/24/2020   Negative pregnancy test 09/24/2020   Depression 11/30/2018   Elevated hemoglobin A1c 11/30/2018   History of PCOS 11/30/2018   Pregnancy examination or test, negative  result 11/30/2018   Asthma 09/18/2015   PCOS (polycystic ovarian syndrome) 09/18/2015   Acanthosis nigricans 08/16/2015   Morbid obesity ([place]) 08/16/2015   Weight gain 08/16/2015   Menorrhagia with irregular cycle 08/16/2015   Dysmenorrhea 08/16/2015   Acne 08/07/2013    PCP: Alvira Monday FNP  REFERRING PROVIDER: Carole Civil, MD   REFERRING DIAG: [phone] (ICD-10-CM) - Chronic pain of left knee   THERAPY DIAG:  Left knee pain, unspecified chronicity  Muscle weakness (generalized)  Other abnormalities of gait and mobility  Other symptoms and signs involving the musculoskeletal system  Rationale for Evaluation and Treatment Rehabilitation  ONSET DATE: Summer 2022  SUBJECTIVE:   SUBJECTIVE STATEMENT: Patient states she fell on July 4th because her leg gave out on her. She was limping on it for a week afterwards. She feels like her leg has gotten worse. She feels that she is ready to be done with PT. She does exercises and then later in the day her legs want to give out and she ends up hurting herself. Has been doing HEP. No improvement since starting. Has been walking on treadmill 20 minutes 3x/week.   PERTINENT HISTORY: history of patella subluxation/dislocation   PAIN:  Are you having pain? No and Yes: NPRS scale: 0/10 Pain location: lt knee Pain description: sharp initially with dull ache afterward Aggravating factors: dancing, walking a lot, certain movements/position Relieving factors: rest, ice  PRECAUTIONS: None  WEIGHT BEARING RESTRICTIONS No  FALLS:  Has  patient fallen in last 6 months? 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  PLOF: Independent  PATIENT GOALS to hopefully get brace to help knee   OBJECTIVE: (measures from initial  evaluation unless otherwise dated)  DIAGNOSTIC FINDINGS: 04/09/22 Impression normal x-ray   PATIENT SURVEYS:  LEFS 70/80  7/18 70/80   COGNITION:  Overall cognitive status: Within functional limits for tasks assessed     SENSATION: WFL    PALPATION: No tenderness throughout bilateral LE, WFL patellar glides all directions bilateral   LOWER EXTREMITY ROM:  Active ROM Right eval Left eval  Hip flexion    Hip extension    Hip abduction    Hip adduction    Hip internal rotation    Hip external rotation    Knee flexion 120 120  Knee extension 0 0  Ankle dorsiflexion    Ankle plantarflexion    Ankle inversion    Ankle eversion     (Blank rows = not tested)  LOWER EXTREMITY MMT:  MMT Right eval Left eval Right 7/18 Left 06/29/22  Hip flexion 5/5 5/5 5/5 5/5  Hip extension 4/5 4/5 5/5 5/5  Hip abduction 4/5 4/5 5/5 5/5  Hip adduction      Hip internal rotation      Hip external rotation      Knee flexion 5/5 5/5 5/5 5/5  Knee extension 5/5 4+/5 5/5 5/5  Ankle dorsiflexion 5/5 5/5 5/5 5/5  Ankle plantarflexion      Ankle inversion      Ankle eversion       (Blank rows = not tested)   FUNCTIONAL TESTS:  5 times sit to stand: 8.66 seconds without UE support, dynamic knee valgus bilaterally Stairs: 7 inch alternating pattern with unilateral UE support ascending, step too pattern descending with R leg due to apprehension on L Squat: unsteady with increasing depth  GAIT: Distance walked: 410 Assistive device utilized: None Level of assistance: Complete Independence Comments: 2MWT: L knee in slight flexed position during IC and stance    TODAY'S TREATMENT: 06/29/22 Reassessment Stairs: alternating ascending on 7 inch, step to descending 7 inch with apprehension for LLE, able to complete on 4 inch steps with alternating pattern 44mt : 525 feet  06/08/22 Seated:  Lt LE LAQ 10X10" holds  STS 10X no UE from standard chair Supine: Bridge 2X10  Single leg  bridge, Rt on green physioball Lt only 2X10  SLR toe in neutral 10X each  SLR toe in ER 10X each  SAQ toe in neutral 10X10"  SAQ toe in ER 10X10" Sidelying: hip abduction 2X10 bil LE Prone:  Hamstring curls 2X10 each  Hip extension 2X10 each STanding: Heelraises 20X on incline  Hip abduction 10X each  Hip extension 10X each  Lunges onto 4" box 10X each  Vectors   05/24/22 Seated: LAQ 10X10" holds Lt LE  STS 10X no UE from standard height Supine: bridge 10X2 sets  SLR toe in neutral 10X each  SLR toe in ER 10X each  SAQ toe in neutral 10X10"  SAQ toe in ER 10X10" Sidelying: hip abduction 10X2 sets each LE Prone: hamstring curls 10X2 each  Hip extension in good form 10X2 each  05/17/22 LAQ 10x 10 second holds LLE STS 1x 10    PATIENT EDUCATION:  Education  details: 7/18 reassessment findings, HEP 05/24/22:  review of goals, HEP and POC moving forward;  eval: Patient educated on exam findings, POC, scope of PT, HEP. Person educated: Patient Education method: Explanation, Demonstration, and Handouts Education comprehension: verbalized understanding, returned demonstration, verbal cues required, and tactile cues required    HOME EXERCISE PROGRAM: 05/24/22 Supine bridge, SLR in neutral, SLR in ER, side lying hip abduction 2X10 2X day  6/5/23Access Code: [email or web address] - Seated Long Arc Quad (Mirrored)  - 3 x daily - 7 x weekly - 10 reps - 10 second holds hold - Sit to Stand with Arms Crossed  - 2 x daily - 7 x weekly - 2-3 sets - 10 reps  ASSESSMENT:  CLINICAL IMPRESSION: Patient has met 1/2 short term goals and 2/5 long term goals with ability to complete HEP and improvement in strength, gait, activity tolerance and functional mobility. She remains limited by intermittent symptoms and c/o LE buckling with falls.  Patient demonstrating gait [facility] today and apprehension with increased height step down, 4 inch step WFL mechanics. Symptoms likely due to impaired functional strength,  endurance.  Reviewed HEP with patient and educated her on continuing and returning to PT if needed. Patient discharged from physical therapy at this time.     OBJECTIVE IMPAIRMENTS Abnormal gait, decreased activity tolerance, decreased mobility, difficulty walking, decreased strength, improper body mechanics, obesity, and pain.   ACTIVITY LIMITATIONS lifting, bending, squatting, stairs, transfers, and locomotion level  PARTICIPATION LIMITATIONS: cleaning, community activity, occupation, and exercise at gym  [place], Time since onset of injury/illness/exacerbation, and 1-2 comorbidities: chronic knee pain, increased BMI  are also affecting patient's functional outcome.   REHAB POTENTIAL: Good  CLINICAL DECISION MAKING: Stable/uncomplicated  EVALUATION COMPLEXITY: Low   GOALS: Goals reviewed with patient? Yes  SHORT TERM GOALS: Target date: 06/07/2022  Patient will be independent with HEP in order to improve functional outcomes. Baseline:  Goal status: MET  2.  Patient will report at least 25% improvement in symptoms for improved quality of life. Baseline:  Goal status: NOT MET   LONG TERM GOALS: Target date: 06/28/2022  Patient will report at least 75% improvement in symptoms for improved quality of life. Baseline:  Goal status: NOT MET  2.  Patient will improve LEFS score by at least 5 points in order to indicate improved tolerance to activity. Baseline: 70/80 06/29/22 70/80 Goal status: NOT MET  3.  Patient will be able to navigate stairs with reciprocal pattern without compensation in order to demonstrate improved LE strength. Baseline: see above Goal status: NOT MET  4.  Patient will be able to ambulate at least 450 feet in 2MWT in order to demonstrate improved tolerance to activity. Baseline: 410 feet 7/18 525 feet  Goal status: MET  5.  Patient will demonstrate grade of 5/5 MMT grade in all tested musculature as evidence of improved strength to  assist with stair ambulation and gait.   Baseline: see MMT Goal status: MET    PLAN: PT FREQUENCY: 1x/week  PT DURATION: 6 weeks  PLANNED INTERVENTIONS: Therapeutic exercises, Therapeutic activity, Neuromuscular re-education, Balance training, Gait training, Patient/Family education, Joint manipulation, Joint mobilization, Stair training, Orthotic/Fit training, DME instructions, Aquatic Therapy, Dry Needling, Electrical stimulation, Spinal manipulation, Spinal mobilization, Cryotherapy, Moist heat, Compression bandaging, scar mobilization, Splintting, Taping, Traction, Ultrasound, Ionotophoresis 43m/ml Dexamethasone, and Manual therapy   PLAN FOR NEXT SESSION:  n/a   AVianne BullsZaunegger, PT 06/29/2022, 9:41 AM

## 2022-06-29 NOTE — Addendum Note (Signed)
Addended by: Wyman Songster on: 06/29/2022 09:44 AM   Modules accepted: Orders

## 2022-07-23 ENCOUNTER — Encounter: Payer: Self-pay | Admitting: Family Medicine

## 2022-07-23 ENCOUNTER — Ambulatory Visit (INDEPENDENT_AMBULATORY_CARE_PROVIDER_SITE_OTHER): Payer: Medicaid Other | Admitting: Family Medicine

## 2022-07-23 VITALS — BP 137/82 | HR 93 | Ht 66.0 in | Wt 338.1 lb

## 2022-07-23 DIAGNOSIS — F419 Anxiety disorder, unspecified: Secondary | ICD-10-CM

## 2022-07-23 DIAGNOSIS — F32A Depression, unspecified: Secondary | ICD-10-CM

## 2022-07-23 DIAGNOSIS — R45851 Suicidal ideations: Secondary | ICD-10-CM | POA: Diagnosis not present

## 2022-07-23 NOTE — Patient Instructions (Addendum)
I appreciate the opportunity to provide care to you today!   -please voluntarily admit yourself to the psychiatry emergency room if you have any suicidal thoughts  -please taper off Prozac over and call me to send your cymbalta in once completed in 1-2 weeks Please get a pill cutter and take Prozac 10mg  daily for three days and 5mg  every other day until discontinued.  Referral: Psychiatry    It was a pleasure to see you and I look forward to continuing to work together on your health and well-being. Please do not hesitate to call the office if you need care or have questions about your care.   Have a wonderful day and week. With Gratitude, MSN, FNP-BC

## 2022-07-23 NOTE — Progress Notes (Unsigned)
Established Patient Office Visit  Subjective:  Patient ID: Andrea Hartman, female    DOB: January 14, 2001  Age: 21 y.o. MRN: [id number]  CC:  Chief Complaint  Patient presents with   Follow-up    1 month follow up, states she doesn't feel any changes since last visit here with anxiety or depression.     HPI Andrea Hartman is a 21 y.o. female with past medical history of anxiety and depression presents for f/u of  chronic medical conditions. Anxiety and depression: c/o of treatment failure with Prozac 20 mg. She reports increased anxiety and worsening of her depression. She reports suicidal thoughts and mood swings. She does not have active written plans of harming herself.   Past Medical History:  Diagnosis Date   Asthma     History reviewed. No pertinent surgical history.  Family History  Problem Relation Age of Onset   Diabetes Mother    Hypertension Mother    Diabetes Maternal Grandmother    Hypertension Maternal Grandmother    Other Maternal Grandmother        vertigo   Healthy Sister    Healthy Sister     Social History   Socioeconomic History   Marital status: Single    Spouse name: Not on file   Number of children: Not on file   Years of education: Not on file   Highest education level: Not on file  Occupational History   Not on file  Tobacco Use   Smoking status: Former    Types: E-cigarettes   Smokeless tobacco: Never  Vaping Use   Vaping Use: Former  Substance and Sexual Activity   Alcohol use: Not Currently   Drug use: Not Currently   Sexual activity: Yes    Birth control/protection: Pill  Other Topics Concern   Not on file  Social History Narrative   Not on file   Social Determinants of Health   Financial Resource Strain: High Risk (09/24/2020)   Overall Financial Resource Strain (CARDIA)    Difficulty of Paying Living Expenses: Very hard  Food Insecurity: Food Insecurity Present (11/05/2020)   Hunger Vital Sign     Worried About Deal in the Last Year: Often true    Ran Out of Food in the Last Year: Often true  Transportation Needs: Unmet Transportation Needs (11/05/2020)   PRAPARE - Hydrologist (Medical): Yes    Lack of Transportation (Non-Medical): Yes  Physical Activity: Insufficiently Active (09/24/2020)   Exercise Vital Sign    Days of Exercise per Week: 1 day    Minutes of Exercise per Session: 10 min  Stress: Stress Concern Present (09/24/2020)   [place]    Feeling of Stress : Very much  Social Connections: Moderately Isolated (09/24/2020)   Social Connection and Isolation Panel [NHANES]    Frequency of Communication with Friends and Family: More than three times a week    Frequency of Social Gatherings with Friends and Family: Twice a week    Attends Religious Services: Never    Marine scientist or Organizations: No    Attends Archivist Meetings: Never    Marital Status: Living with partner  Intimate Partner Violence: Not At Risk (09/24/2020)   Humiliation, Afraid, Rape, and Kick questionnaire    Fear of Current or Ex-Partner: No    Emotionally Abused: No    Physically Abused: No  Sexually Abused: No    Outpatient Medications Prior to Visit  Medication Sig Dispense Refill   FLUoxetine (PROZAC) 20 MG tablet Take 1 tablet (20 mg total) by mouth daily. 90 tablet 3   ibuprofen (ADVIL) 800 MG tablet Take 1 tablet (800 mg total) by mouth every 8 (eight) hours as needed. 60 tablet 3   Magnesium Oxide 400 MG CAPS Take 1 capsule (400 mg total) by mouth daily. 90 capsule 1   Norethindrone Acetate-Ethinyl Estradiol (LOESTRIN) 1.5-30 MG-MCG tablet Take 1 tablet by mouth daily. 28 tablet 11   No facility-administered medications prior to visit.    Allergies  Allergen Reactions   Latex Rash    ROS Review of Systems  Respiratory:  Negative for choking, chest  tightness and shortness of breath.   Gastrointestinal:  Negative for nausea and vomiting.  Psychiatric/Behavioral:  Positive for suicidal ideas (last week with none reported today). The patient is not nervous/anxious.       Objective:    Physical Exam HENT:     Mouth/Throat:     Mouth: Mucous membranes are moist.  Cardiovascular:     Rate and Rhythm: Normal rate and regular rhythm.     Pulses: Normal pulses.     Heart sounds: Normal heart sounds.  Pulmonary:     Effort: Pulmonary effort is normal.     Breath sounds: Normal breath sounds.  Neurological:     Mental Status: She is alert.     BP 137/82   Pulse 93   Ht '5\' 6"'  (1.676 m)   Wt (!) 338 lb 1.9 oz (153.4 kg)   SpO2 96%   BMI 54.57 kg/m  Wt Readings from Last 3 Encounters:  07/23/22 (!) 338 lb 1.9 oz (153.4 kg)  06/22/22 (!) 338 lb 12.8 oz (153.7 kg)  03/24/22 (!) 327 lb 12.8 oz (148.7 kg)    Lab Results  Component Value Date   TSH 3.120 03/24/2022   Lab Results  Component Value Date   WBC 9.5 03/24/2022   HGB 13.1 03/24/2022   HCT 40.4 03/24/2022   MCV 79 03/24/2022   PLT 425 03/24/2022   Lab Results  Component Value Date   NA 137 03/24/2022   K 4.9 03/24/2022   CO2 22 03/24/2022   GLUCOSE 90 03/24/2022   BUN 12 03/24/2022   CREATININE 0.81 03/24/2022   BILITOT 0.2 03/24/2022   ALKPHOS 99 03/24/2022   AST 10 03/24/2022   ALT 22 03/24/2022   PROT 7.2 03/24/2022   ALBUMIN 4.1 03/24/2022   CALCIUM 9.8 03/24/2022   ANIONGAP 10 08/30/2020   EGFR 107 03/24/2022   Lab Results  Component Value Date   CHOL 211 (H) 03/24/2022   Lab Results  Component Value Date   HDL 54 03/24/2022   Lab Results  Component Value Date   LDLCALC 135 (H) 03/24/2022   Lab Results  Component Value Date   TRIG 122 03/24/2022   Lab Results  Component Value Date   CHOLHDL 3.9 03/24/2022   Lab Results  Component Value Date   HGBA1C 5.7 (H) 03/24/2022      Assessment & Plan:   Problem List Items Addressed  This Visit       Other   Anxiety and depression - Primary    C/o of treatment failure with Prozac 20 mg She reports increased anxiety and worsening of her depression She reports suicidal thoughts and mood swings She does not have active written plans of harming herself Informed  pt to taper off Prozac with a pill cutter for the next 1-2 weeks  Informed pt to take Prozac 65m daily for three days and 539mevery other day until discontinued Encouraged pt call the clinic to be started on Cymbalta once tapered off Prozac Urgent referral placed to the psychiatry Informed pt to voluntarily admit  to the psychiatry emergency room if having suicidal thought or homicidal thoughts       Relevant Orders   Ambulatory referral to Psychiatry   Other Visit Diagnoses     Verbalizes suicidal thoughts       Relevant Orders   Ambulatory referral to Psychiatry       No orders of the defined types were placed in this encounter.   Follow-up: Return in about 1 month (around 08/23/2022) for depression.    GlAlvira MondayFNP

## 2022-07-25 NOTE — Assessment & Plan Note (Signed)
C/o of treatment failure with Prozac 20 mg She reports increased anxiety and worsening of her depression She reports suicidal thoughts and mood swings She does not have active written plans of harming herself Informed pt to taper off Prozac with a pill cutter for the next 1-2 weeks  Informed pt to take Prozac 10mg  daily for three days and 5mg  every other day until discontinued Encouraged pt call the clinic to be started on Cymbalta once tapered off Prozac Urgent referral placed to the psychiatry Informed pt to voluntarily admit  to the psychiatry emergency room if having suicidal thought or homicidal thoughts

## 2022-07-26 ENCOUNTER — Other Ambulatory Visit: Payer: Self-pay | Admitting: Family Medicine

## 2022-07-26 MED ORDER — FLUOXETINE HCL 10 MG PO TABS: 10.0000 mg | ORAL_TABLET | Freq: Every day | ORAL | 0 refills | Status: DC

## 2022-07-26 NOTE — Telephone Encounter (Signed)
Please inform the patient that I've sent a prescription for Prozac 10mg  to her pharmacy.  Please advice her to take the 10 mg daily for 3 days and every other day until discontinued

## 2022-08-11 ENCOUNTER — Other Ambulatory Visit: Payer: Self-pay | Admitting: Family Medicine

## 2022-08-11 DIAGNOSIS — F321 Major depressive disorder, single episode, moderate: Secondary | ICD-10-CM

## 2022-08-11 MED ORDER — DULOXETINE HCL 60 MG PO CPEP: 60.0000 mg | ORAL_CAPSULE | Freq: Every day | ORAL | 3 refills | Status: DC

## 2022-08-11 NOTE — Telephone Encounter (Signed)
Cymbalta sent to her pharmacy

## 2022-09-23 ENCOUNTER — Ambulatory Visit: Payer: Medicaid Other | Admitting: Family Medicine

## 2022-11-06 ENCOUNTER — Other Ambulatory Visit: Payer: Self-pay | Admitting: Family Medicine

## 2022-11-06 DIAGNOSIS — G43109 Migraine with aura, not intractable, without status migrainosus: Secondary | ICD-10-CM

## 2022-12-25 ENCOUNTER — Encounter: Payer: Self-pay | Admitting: Family Medicine

## 2022-12-27 ENCOUNTER — Other Ambulatory Visit: Payer: Self-pay | Admitting: Adult Health

## 2022-12-27 MED ORDER — NORETHINDRONE ACET-ETHINYL EST 1.5-30 MG-MCG PO TABS: 1.0000 | ORAL_TABLET | Freq: Every day | ORAL | 6 refills | Status: DC

## 2022-12-27 NOTE — Progress Notes (Signed)
Refilled loestrin

## 2023-02-07 ENCOUNTER — Ambulatory Visit: Payer: Medicaid Other | Admitting: Family Medicine

## 2023-02-07 ENCOUNTER — Encounter: Payer: Self-pay | Admitting: Family Medicine

## 2023-02-09 ENCOUNTER — Other Ambulatory Visit: Payer: Self-pay

## 2023-02-09 ENCOUNTER — Encounter (HOSPITAL_COMMUNITY): Payer: Self-pay

## 2023-02-09 ENCOUNTER — Emergency Department (HOSPITAL_COMMUNITY)
Admission: EM | Admit: 2023-02-09 | Discharge: 2023-02-09 | Disposition: A | Discharge status: Home / Self Care | Payer: Self-pay | Attending: Emergency Medicine | Admitting: Emergency Medicine

## 2023-02-09 DIAGNOSIS — L0291 Cutaneous abscess, unspecified: Secondary | ICD-10-CM

## 2023-02-09 DIAGNOSIS — L0211 Cutaneous abscess of neck: Secondary | ICD-10-CM | POA: Insufficient documentation

## 2023-02-09 DIAGNOSIS — Z91040 Latex allergy status: Secondary | ICD-10-CM | POA: Insufficient documentation

## 2023-02-09 DIAGNOSIS — J45909 Unspecified asthma, uncomplicated: Secondary | ICD-10-CM | POA: Insufficient documentation

## 2023-02-09 MED ORDER — SULFAMETHOXAZOLE-TRIMETHOPRIM 800-160 MG PO TABS: 1.0000 | ORAL_TABLET | Freq: Two times a day (BID) | ORAL | 0 refills | Status: DC

## 2023-02-09 MED ORDER — SULFAMETHOXAZOLE-TRIMETHOPRIM 800-160 MG PO TABS: 1.0000 | ORAL_TABLET | Freq: Two times a day (BID) | ORAL | 0 refills | Status: AC

## 2023-02-09 NOTE — ED Provider Notes (Signed)
[facility] Provider Note   CSN: [id number] Arrival date & time: 02/09/23  1212     History  Chief Complaint  Patient presents with   Abscess    Andrea Hartman is a 22 y.o. female.  With past medical history of obesity, asthma, PCOS, migraines who presents to the emergency department with reported abscess.  States that she has had a draining lump on the back of her neck for the past 1 month.  She states that she has noticed a lump over the past 1 year.  She states over the past month it intermittently drains after it becomes painful for about a day.  She describes when it becomes painful she presses on it and it drains and the pain goes away.  She denies having any fevers, nausea.   Abscess      Home Medications Prior to Admission medications   Medication Sig Start Date End Date Taking? Authorizing Provider  DULoxetine (CYMBALTA) 60 MG capsule Take 1 capsule (60 mg total) by mouth daily. 08/11/22   Alvira Monday, FNP  ibuprofen (ADVIL) 800 MG tablet Take 1 tablet (800 mg total) by mouth every 8 (eight) hours as needed. 01/04/22   Estill Dooms, NP  MAGNESIUM-OXIDE 400 (240 Mg) MG tablet Take 1 tablet by mouth once daily 11/08/22   Alvira Monday, FNP  Norethindrone Acetate-Ethinyl Estradiol (LOESTRIN) 1.5-30 MG-MCG tablet Take 1 tablet by mouth daily. 12/27/22   Estill Dooms, NP  sulfamethoxazole-trimethoprim (BACTRIM DS) 800-160 MG tablet Take 1 tablet by mouth 2 (two) times daily for 7 days. 02/09/23 02/16/23  Mickie Hillier, PA-C      Allergies    Latex    Review of Systems   Review of Systems  Skin:  Positive for wound.  All other systems reviewed and are negative.   Physical Exam Updated Vital Signs BP 131/89   Pulse 86   Temp 98.2 F (36.8 C) (Oral)   Resp 18   Ht '5\' 6"'$  (1.676 m)   Wt (!) 147.4 kg   SpO2 100%   BMI 52.46 kg/m  Physical Exam Vitals and nursing note reviewed.   Constitutional:      General: She is not in acute distress.    Appearance: She is obese. She is not ill-appearing or toxic-appearing.  HENT:     Head: Normocephalic and atraumatic.  Eyes:     General: No scleral icterus. Pulmonary:     Effort: Pulmonary effort is normal. No respiratory distress.  Musculoskeletal:     Cervical back: Normal range of motion and neck supple.  Skin:    General: Skin is warm and dry.     Findings: No rash.     Comments: There are 2 small hardened areas under the skin on the posterior neck about midline.  Between the 2 areas is a very small opening.  I am able to press on this and have small amount of purulent drainage.  There is no surrounding cellulitis.  She does have acanthosis nigracans.  Neurological:     General: No focal deficit present.     Mental Status: She is alert and oriented to person, place, and time.  Psychiatric:        Mood and Affect: Mood normal.        Behavior: Behavior normal.        Thought Content: Thought content normal.        Judgment: Judgment normal.  ED Results / Procedures / Treatments   Labs (all labs ordered are listed, but only abnormal results are displayed) Labs Reviewed - No data to display  EKG None  Radiology No results found.  Procedures Procedures   Medications Ordered in ED Medications - No data to display  ED Course/ Medical Decision Making/ A&P    Medical Decision Making Risk Prescription drug management.  Initial Impression and Ddx 22 year old female who presents to the emergency department with reported abscess. Patient PMH that increases complexity of ED encounter: Asthma, obesity, PCOS, migraines  Interpretation of Diagnostics I independent reviewed and interpreted the labs as followed: Not indicated  - I independently visualized the following imaging with scope of interpretation limited to determining acute life threatening conditions related to emergency care:  Indicated  Patient Reassessment and Ultimate Disposition/Management 23 year old female who presents to the emergency department with a lump to the back of the neck. She is nonseptic and nontoxic appearing.  Afebrile. The posterior neck does have likely a small abscess as noted in the physical exam.  She has had no recent injections to this area and I have low concern for epidural abscess or deeper space infection. May have previously been a cyst here if this has been ongoing for about 1 year. Do not feel that it needs intervention at this time as has been draining.  Will prescribe her Bactrim given small amount of purulence there.  She is instructed to follow-up with her primary care provider in 1 week after she finishes antibiotics to recheck the wound.  Given return precautions if this were to significantly worsen or if she were to develop fever.  She verbalized understanding.  Otherwise feel that she is appropriate for discharge at this time.  The patient has been appropriately medically screened and/or stabilized in the ED. I have low suspicion for any other emergent medical condition which would require further screening, evaluation or treatment in the ED or require inpatient management. At time of discharge the patient is hemodynamically stable and in no acute distress. I have discussed work-up results and diagnosis with patient and answered all questions. Patient is agreeable with discharge plan. We discussed strict return precautions for returning to the emergency department and they verbalized understanding.     Patient management required discussion with the following services or consulting groups:  None  Complexity of Problems Addressed Acute uncomplicated illness or injury with no diagnostics  Additional Data Reviewed and Analyzed Further history obtained from: Past medical history and medications listed in the EMR, Prior ED visit notes, and Care Everywhere  Patient Encounter Risk  Assessment Prescriptions  Final Clinical Impression(s) / ED Diagnoses Final diagnoses:  Abscess    Rx / DC Orders ED Discharge Orders          Ordered    sulfamethoxazole-trimethoprim (BACTRIM DS) 800-160 MG tablet  2 times daily,   Status:  Discontinued        02/09/23 1311    sulfamethoxazole-trimethoprim (BACTRIM DS) 800-160 MG tablet  2 times daily        02/09/23 1311              Mickie Hillier, PA-C 02/09/23 1323    Margette Fast, MD 02/10/23 2357

## 2023-02-09 NOTE — Discharge Instructions (Addendum)
You were seen in the emergency department today for an abscess.  I am placing on antibiotic called Bactrim until take twice a day over the next 7 days.  Please follow-up with your primary care provider once you finish your antibiotics to have a recheck of your wound.  Please return to emergency department if you have severely worsening symptoms.

## 2023-02-09 NOTE — ED Triage Notes (Signed)
Pt presents to ED from home C/O bump on back of neck that started "leaking fluid" about a week ago. Pt reports the bump has been there for "a while" before it started leaking.

## 2023-02-10 ENCOUNTER — Telehealth: Payer: Self-pay

## 2023-02-10 ENCOUNTER — Encounter: Payer: Self-pay | Admitting: Radiology

## 2023-02-10 NOTE — Transitions of Care (Post Inpatient/ED Visit) (Signed)
   02/10/2023  Name: Andrea Hartman MRN: [id number] DOB: 02/19/2001  Today's TOC FU Call Status: Today's TOC FU Call Status:: Unsuccessul Call (1st Attempt) Unsuccessful Call (1st Attempt) Date: 02/10/23  Attempted to reach the patient regarding the most recent Inpatient/ED visit.  Follow Up Plan: Additional outreach attempts will be made to reach the patient to complete the Transitions of Care (Post Inpatient/ED visit) call.   [place] LPN [place] Advisor Direct Dial [phone]

## 2023-02-14 NOTE — Transitions of Care (Post Inpatient/ED Visit) (Signed)
   02/14/2023  Name: Andrea Hartman MRN: [id number] DOB: 2001/04/08  Today's TOC FU Call Status: Today's TOC FU Call Status:: Unsuccessful Call (2nd Attempt) Unsuccessful Call (1st Attempt) Date: 02/10/23 Unsuccessful Call (2nd Attempt) Date: 02/14/23  Attempted to reach the patient regarding the most recent Inpatient/ED visit.  Follow Up Plan: No further outreach attempts will be made at this time. We have been unable to contact the patient.  [place] LPN [place] Advisor Direct Dial [phone]

## 2023-02-15 ENCOUNTER — Ambulatory Visit: Payer: Medicaid Other | Admitting: Family Medicine

## 2023-03-07 ENCOUNTER — Ambulatory Visit: Payer: Medicaid Other | Admitting: Family Medicine

## 2023-05-12 ENCOUNTER — Telehealth: Payer: Self-pay

## 2023-05-12 NOTE — Telephone Encounter (Signed)
LVM, also sending mychart msg. AS, CMA 

## 2023-10-10 ENCOUNTER — Other Ambulatory Visit (HOSPITAL_COMMUNITY)
Admission: RE | Admit: 2023-10-10 | Discharge: 2023-10-10 | Disposition: A | Discharge status: Home / Self Care | Payer: Self-pay | Source: Ambulatory Visit | Attending: Adult Health | Admitting: Adult Health

## 2023-10-10 ENCOUNTER — Ambulatory Visit (INDEPENDENT_AMBULATORY_CARE_PROVIDER_SITE_OTHER): Payer: Self-pay | Admitting: Adult Health

## 2023-10-10 ENCOUNTER — Encounter: Payer: Self-pay | Admitting: Adult Health

## 2023-10-10 VITALS — BP 123/80 | HR 84 | Ht 66.0 in | Wt 338.6 lb

## 2023-10-10 DIAGNOSIS — Z3041 Encounter for surveillance of contraceptive pills: Secondary | ICD-10-CM

## 2023-10-10 DIAGNOSIS — Z124 Encounter for screening for malignant neoplasm of cervix: Secondary | ICD-10-CM | POA: Insufficient documentation

## 2023-10-10 MED ORDER — NORETHINDRONE ACET-ETHINYL EST 1.5-30 MG-MCG PO TABS: 1.0000 | ORAL_TABLET | Freq: Every day | ORAL | 12 refills | Status: DC

## 2023-10-10 NOTE — Progress Notes (Signed)
  Subjective:     Patient ID: Andrea Hartman, female   DOB: 06-28-2001, 22 y.o.   MRN: [id number]  HPI Andrea Hartman is a 22 year old black female,with SO,[id number], in requesting refills on BCP, which she is happy with and she needs her first pap.  PCP is Gilmore Laroche FNP  Review of Systems Patient denies any headaches, hearing loss, fatigue, blurred vision, shortness of breath, chest pain, abdominal pain, problems with bowel movements, urination, or intercourse. No joint pain or mood swings.    Periods are good.  Reviewed past medical,surgical, social and family history. Reviewed medications and allergies.  Objective:   Physical Exam BP 123/80 (BP Location: Left Arm, Patient Position: Sitting, Cuff Size: Large)   Pulse 84   Ht 5\' 6"  (1.676 m)   Wt (!) 338 lb 9.6 oz (153.6 kg)   BMI 54.65 kg/m     Skin warm and dry.Pelvic: external genitalia is normal in appearance no lesions, vagina is pink ,urethra has no lesions or masses noted, cervix:smooth, pap with GC/Chl performed, uterus: normal size, shape and contour, non tender, no masses felt, adnexa: no masses or tenderness noted. Bladder is non tender and no masses felt.  Fall risk is low  [facility] - 10/10/23 1523       Pregnancy Intention Screening   Does the patient want to become pregnant in the next year? No    Does the patient's partner want to become pregnant in the next year? No    Would the patient like to discuss contraceptive options today? No      Contraception Wrap Up   Current Method Oral Contraceptive    End Method Oral Contraceptive    Contraception Counseling Provided Yes            Examination chaperoned by Malachy Mood LPN  Assessment:     1. Routine Papanicolaou smear Pap sent Pap in 3 years if normal - Cytology - PAP( [place])  2. Encounter for surveillance of contraceptive pills Happy with BCP,refill sent Meds ordered this encounter  Medications   Norethindrone Acetate-Ethinyl Estradiol  (LOESTRIN) 1.5-30 MG-MCG tablet    Sig: Take 1 tablet by mouth daily.    Dispense:  28 tablet    Refill:  12    Order Specific Question:   Supervising Provider    Answer:   Lazaro Arms [[id number]]       Plan:     Follow up in 1 year or sooner if needed

## 2023-10-12 LAB — CYTOLOGY - PAP
Chlamydia: NEGATIVE
Comment: NEGATIVE
Comment: NORMAL
Diagnosis: NEGATIVE
Neisseria Gonorrhea: NEGATIVE

## 2023-11-05 ENCOUNTER — Encounter: Payer: Self-pay | Admitting: Family Medicine

## 2023-11-09 ENCOUNTER — Other Ambulatory Visit: Payer: Self-pay | Admitting: Family Medicine

## 2023-11-09 DIAGNOSIS — J4520 Mild intermittent asthma, uncomplicated: Secondary | ICD-10-CM

## 2023-11-09 MED ORDER — ALBUTEROL SULFATE HFA 108 (90 BASE) MCG/ACT IN AERS: 2.0000 | INHALATION_SPRAY | Freq: Four times a day (QID) | RESPIRATORY_TRACT | 2 refills | Status: AC | PRN

## 2024-03-19 ENCOUNTER — Ambulatory Visit (INDEPENDENT_AMBULATORY_CARE_PROVIDER_SITE_OTHER): Payer: Self-pay | Admitting: Family Medicine

## 2024-03-19 ENCOUNTER — Encounter: Payer: Self-pay | Admitting: Family Medicine

## 2024-03-19 VITALS — BP 131/84 | HR 88 | Ht 66.0 in | Wt 342.1 lb

## 2024-03-19 DIAGNOSIS — L304 Erythema intertrigo: Secondary | ICD-10-CM | POA: Diagnosis not present

## 2024-03-19 DIAGNOSIS — L0291 Cutaneous abscess, unspecified: Secondary | ICD-10-CM

## 2024-03-19 DIAGNOSIS — E559 Vitamin D deficiency, unspecified: Secondary | ICD-10-CM

## 2024-03-19 DIAGNOSIS — R7301 Impaired fasting glucose: Secondary | ICD-10-CM | POA: Diagnosis not present

## 2024-03-19 DIAGNOSIS — E7849 Other hyperlipidemia: Secondary | ICD-10-CM

## 2024-03-19 DIAGNOSIS — E038 Other specified hypothyroidism: Secondary | ICD-10-CM

## 2024-03-19 MED ORDER — SULFAMETHOXAZOLE-TRIMETHOPRIM 800-160 MG PO TABS: 1.0000 | ORAL_TABLET | Freq: Two times a day (BID) | ORAL | 0 refills | Status: AC

## 2024-03-19 MED ORDER — MUPIROCIN 2 % EX OINT: 1.0000 | TOPICAL_OINTMENT | Freq: Two times a day (BID) | CUTANEOUS | 0 refills | Status: AC

## 2024-03-19 MED ORDER — CLOTRIMAZOLE 1 % EX CREA
1.0000 | TOPICAL_CREAM | Freq: Two times a day (BID) | CUTANEOUS | 0 refills | Status: DC
Start: 2024-03-19 — End: 2024-04-12

## 2024-03-19 NOTE — Progress Notes (Signed)
 Acute Office Visit  Subjective:    Patient ID: Andrea Hartman, female    DOB: May 03, 2001, 23 y.o.   MRN: [id number]  Chief Complaint  Patient presents with   Acute Visit    Lump: center of back of neck. It also creates more bumps around it.  Pt. Reports that it secrets clear/ light yellow drainage, it itches, and sometimes causes pain.     HPI The patient presents today with the above complaints, noting pain when moving her neck and tenderness at the drainage site. She was seen in the emergency department on 02/10/2024 for similar symptoms. The condition has been ongoing for approximately one year, with frequent flare-ups and intermittent drainage.   Past Medical History:  Diagnosis Date   Asthma     No past surgical history on file.  Family History  Problem Relation Age of Onset   Diabetes Mother    Hypertension Mother    Diabetes Maternal Grandmother    Hypertension Maternal Grandmother    Other Maternal Grandmother        vertigo   Healthy Sister    Healthy Sister     Social History   Socioeconomic History   Marital status: Significant Other    Spouse name: Not on file   Number of children: Not on file   Years of education: Not on file   Highest education level: Not on file  Occupational History   Not on file  Tobacco Use   Smoking status: Former    Types: E-cigarettes   Smokeless tobacco: Never  Vaping Use   Vaping status: Former  Substance and Sexual Activity   Alcohol use: Yes    Comment: Occasional   Drug use: Not Currently   Sexual activity: Yes    Birth control/protection: Pill  Other Topics Concern   Not on file  Social History Narrative   Not on file   Social Drivers of Health   Financial Resource Strain: High Risk (09/24/2020)   Overall Financial Resource Strain (CARDIA)    Difficulty of Paying Living Expenses: Very hard  Food Insecurity: Food Insecurity Present (11/05/2020)   Hunger Vital Sign    Worried About Running Out  of Food in the Last Year: Often true    Ran Out of Food in the Last Year: Often true  Transportation Needs: Unmet Transportation Needs (11/05/2020)   PRAPARE - Administrator, Civil Service (Medical): Yes    Lack of Transportation (Non-Medical): Yes  Physical Activity: Insufficiently Active (09/24/2020)   Exercise Vital Sign    Days of Exercise per Week: 1 day    Minutes of Exercise per Session: 10 min  Stress: Stress Concern Present (09/24/2020)   [employer] of Occupational Health - Occupational Stress Questionnaire    Feeling of Stress : Very much  Social Connections: Moderately Isolated (09/24/2020)   Social Connection and Isolation Panel [NHANES]    Frequency of Communication with Friends and Family: More than three times a week    Frequency of Social Gatherings with Friends and Family: Twice a week    Attends Religious Services: Never    Database administrator or Organizations: No    Attends Banker Meetings: Never    Marital Status: Living with partner  Intimate Partner Violence: Not At Risk (09/24/2020)   Humiliation, Afraid, Rape, and Kick questionnaire    Fear of Current or Ex-Partner: No    Emotionally Abused: No    Physically Abused: No  Sexually Abused: No    Outpatient Medications Prior to Visit  Medication Sig Dispense Refill   albuterol (VENTOLIN HFA) 108 (90 Base) MCG/ACT inhaler Inhale 2 puffs into the lungs every 6 (six) hours as needed for wheezing or shortness of breath. 8 g 2   fexofenadine (ALLEGRA ODT) 30 MG disintegrating tablet Take 185 mg by mouth daily.     fluticasone (FLONASE) 50 MCG/ACT nasal spray Place 1 spray into both nostrils daily.     LARIN FE 1.5/30 1.5-30 MG-MCG tablet Take 1 tablet by mouth daily.     Norethindrone Acetate-Ethinyl Estradiol (LOESTRIN) 1.5-30 MG-MCG tablet Take 1 tablet by mouth daily. 28 tablet 12   No facility-administered medications prior to visit.    Allergies  Allergen Reactions    Latex Rash    Review of Systems  Constitutional:  Negative for chills and fever.  Eyes:  Negative for visual disturbance.  Respiratory:  Negative for chest tightness and shortness of breath.   Skin:  Positive for rash (armpit) and wound (posterior neck).  Neurological:  Negative for dizziness and headaches.       Objective:    Physical Exam HENT:     Head: Normocephalic.     Mouth/Throat:     Mouth: Mucous membranes are moist.  Cardiovascular:     Rate and Rhythm: Normal rate.     Heart sounds: Normal heart sounds.  Pulmonary:     Effort: Pulmonary effort is normal.     Breath sounds: Normal breath sounds.  Skin:    Findings: Lesion and rash present.  Neurological:     Mental Status: She is alert.     BP 131/84   Pulse 88   Ht 5\' 6"  (1.676 m)   Wt (!) 342 lb 1.3 oz (155.2 kg)   SpO2 98%   BMI 55.21 kg/m  Wt Readings from Last 3 Encounters:  03/19/24 (!) 342 lb 1.3 oz (155.2 kg)  10/10/23 (!) 338 lb 9.6 oz (153.6 kg)  02/09/23 (!) 325 lb (147.4 kg)       Assessment & Plan:  Abscess Assessment & Plan: Initiate Bactrim (Sulfamethoxazole/Trimethoprim) 800 mg/160 mg orally twice daily for 7 days. Apply Mupirocin (Bactroban) to the affected area to reduce bacterial colonization on the skin. Encourage warm compresses to the affected area for 20 minutes several times a day to increase blood circulation, promote drainage, and relieve discomfort. Advise gently washing the area with mild soap and water. Instruct the patient to avoid squeezing or picking at the lesion to prevent the spread of infection. Dermatology referral has been placed for further evaluation and management.   Orders: -     Sulfamethoxazole-Trimethoprim; Take 1 tablet by mouth 2 (two) times daily for 7 days.  Dispense: 14 tablet; Refill: 0 -     Ambulatory referral to Dermatology -     Mupirocin; Apply 1 Application topically 2 (two) times daily.  Dispense: 22 g; Refill: 0  Intertrigo Assessment &  Plan: The patient is encouraged to start applying clotrimazole 1% cream to the affected area twice daily. Nonpharmacologic interventions for intertrigo were discussed, including: Keeping the affected area clean and dry Using absorbent materials (e.g., gauze or moisture-wicking fabrics) to reduce friction and moisture buildup Wearing loose-fitting, breathable clothing Avoiding prolonged moisture exposure (e.g., sweating or wet clothing) Maintaining proper hygiene and regularly changing undergarments     Orders: -     Clotrimazole; Apply 1 Application topically 2 (two) times daily.  Dispense: 30 g; Refill: 0  IFG (impaired fasting glucose) -     Hemoglobin A1c  Vitamin D deficiency -     VITAMIN D 25 Hydroxy (Vit-D Deficiency, Fractures)  TSH (thyroid-stimulating hormone deficiency) -     TSH + free T4  Other hyperlipidemia -     Lipid panel -     CMP14+EGFR -     CBC with Differential/Platelet  Note: This chart has been completed using Engineer, civil (consulting) software, and while attempts have been made to ensure accuracy, certain words and phrases may not be transcribed as intended.    Gilmore Laroche, FNP

## 2024-03-19 NOTE — Assessment & Plan Note (Signed)
 Initiate Bactrim (Sulfamethoxazole/Trimethoprim) 800 mg/160 mg orally twice daily for 7 days. Apply Mupirocin (Bactroban) to the affected area to reduce bacterial colonization on the skin. Encourage warm compresses to the affected area for 20 minutes several times a day to increase blood circulation, promote drainage, and relieve discomfort. Advise gently washing the area with mild soap and water. Instruct the patient to avoid squeezing or picking at the lesion to prevent the spread of infection. Dermatology referral has been placed for further evaluation and management.

## 2024-03-19 NOTE — Assessment & Plan Note (Signed)
 The patient is encouraged to start applying clotrimazole 1% cream to the affected area twice daily. Nonpharmacologic interventions for intertrigo were discussed, including: Keeping the affected area clean and dry Using absorbent materials (e.g., gauze or moisture-wicking fabrics) to reduce friction and moisture buildup Wearing loose-fitting, breathable clothing Avoiding prolonged moisture exposure (e.g., sweating or wet clothing) Maintaining proper hygiene and regularly changing undergarments

## 2024-03-19 NOTE — Patient Instructions (Addendum)
 I appreciate the opportunity to provide care to you today!    Follow up:  6 months  Labs: please stop by the lab today to get your blood drawn (CBC, CMP, TSH, Lipid profile, HgA1c, Vit D)  Abscess Management:  Antibiotic Therapy: Take Bactrim (Sulfamethoxazole/Trimethoprim) 800mg /160 mg twice daily (BID) for 7 days Topical Antibiotic: Apply Mupirocin (Bactroban) to the affected area to reduce bacterial colonization on the skin.  Here are some nonpharmacological interventions to help manage and alleviate symptoms associated with boils:  Warm Compresses: Application: Apply a warm, moist compress to the affected area for 20 minutes several times a day. This can help increase blood circulation, promote drainage, and ease discomfort. Proper Hygiene: Cleansing: Gently wash the area around the boil with mild soap and water. Avoid squeezing or picking at the boil, as this can spread infection. Keep the Area Clean and Dry: Dressings: Cover the boil with a sterile bandage or dressing to protect it and keep it clean. Change the dressing regularly, especially if it becomes wet or soiled. Avoid Tight Clothing: Comfort: Wear loose, breathable clothing to prevent irritation and friction on the boil. Good Nutrition: Diet: Maintain a balanced diet rich in vitamins and minerals to support your immune system and overall skin health. Foods high in vitamin C, such as citrus fruits and leafy greens, can be beneficial. Stay Hydrated: Fluids: Drink plenty of water to support overall health and help your body fight the infection. Natural Remedies: Tea Tree Oil: Applying diluted tea tree oil (mixed with a carrier oil) to the boil may have antimicrobial properties and help with healing. Turmeric: Applying a paste made from turmeric powder and water to the boil may help reduce inflammation due to its natural anti-inflammatory properties. Avoid Sharing Personal Items: Prevention: Do not share towels, razors, or  other personal items to prevent the spread of infection. Rest: Recovery: Ensure you get adequate rest to help your body fight the infection and recover more quickly.   Referrals today-  Dermatology    Please continue to a heart-healthy diet and increase your physical activities. Try to exercise for at least five days a week.    It was a pleasure to see you and I look forward to continuing to work together on your health and well-being. Please do not hesitate to call the office if you need care or have questions about your care.  In case of emergency, please visit the Emergency Department for urgent care, or contact our clinic at [phone] to schedule an appointment. We're here to help you!   Have a wonderful day and week. With Gratitude, Gilmore Laroche MSN, FNP-BC

## 2024-04-12 ENCOUNTER — Other Ambulatory Visit: Payer: Self-pay | Admitting: Family Medicine

## 2024-04-12 DIAGNOSIS — L304 Erythema intertrigo: Secondary | ICD-10-CM

## 2024-09-09 ENCOUNTER — Encounter: Payer: Self-pay | Admitting: Family Medicine

## 2024-09-13 ENCOUNTER — Other Ambulatory Visit: Payer: Self-pay

## 2024-09-13 DIAGNOSIS — F32A Depression, unspecified: Secondary | ICD-10-CM

## 2024-09-17 ENCOUNTER — Ambulatory Visit: Admitting: Family Medicine

## 2024-10-18 ENCOUNTER — Other Ambulatory Visit: Payer: Self-pay | Admitting: Adult Health

## 2024-12-10 ENCOUNTER — Ambulatory Visit: Admitting: Family Medicine

## 2024-12-24 ENCOUNTER — Ambulatory Visit: Admitting: Family Medicine

## 2024-12-25 ENCOUNTER — Encounter: Payer: Self-pay | Admitting: Adult Health

## 2024-12-25 ENCOUNTER — Ambulatory Visit: Admitting: Adult Health

## 2024-12-25 VITALS — BP 129/84 | HR 92 | Ht 66.0 in | Wt 355.0 lb

## 2024-12-25 DIAGNOSIS — Z3041 Encounter for surveillance of contraceptive pills: Secondary | ICD-10-CM | POA: Diagnosis not present

## 2024-12-25 MED ORDER — NORETHIN ACE-ETH ESTRAD-FE 1.5-30 MG-MCG PO TABS: 1.0000 | ORAL_TABLET | Freq: Every day | ORAL | 3 refills | Status: AC

## 2024-12-25 NOTE — Progress Notes (Signed)
" °  Subjective:     Patient ID: Andrea Hartman, female   DOB: September 26, 2001, 24 y.o.   MRN: [id number]  HPI Logan is a 24 year old black female, with SO, G0P0, in to get refills on Larin Fe 1.5/30.    Component Value Date/Time   DIAGPAP  10/10/2023 1525    - Negative for intraepithelial lesion or malignancy (NILM)   ADEQPAP  10/10/2023 1525    Satisfactory for evaluation; transformation zone component PRESENT.   PCP is Meade Flavin FNP  Review of Systems Patient denies any headaches, hearing loss, fatigue, blurred vision, shortness of breath, chest pain, abdominal pain, problems with bowel movements, urination, or intercourse. No joint pain or mood swings.    Happy with BCP Reviewed past medical,surgical, social and family history. Reviewed medications and allergies.  Objective:   Physical Exam BP 129/84 (BP Location: Right Arm, Patient Position: Sitting, Cuff Size: Large)   Pulse 92   Ht 5' 6 (1.676 m)   Wt (!) 355 lb (161 kg)   LMP 12/13/2024 (Approximate)   BMI 57.30 kg/m     Skin warm and dry.  Lungs: clear to ausculation bilaterally. Cardiovascular: regular rate and rhythm.  Fall risk is low  Upstream - 12/25/24 1456       Pregnancy Intention Screening   Does the patient want to become pregnant in the next year? No    Does the patient's partner want to become pregnant in the next year? No    Would the patient like to discuss contraceptive options today? No      Contraception Wrap Up   Current Method Oral Contraceptive    End Method Oral Contraceptive    Contraception Counseling Provided Yes          Assessment:     1. Encounter for surveillance of contraceptive pills (Primary) Happy with Larin Fe 1.5/30 will refill Meds ordered this encounter  Medications   norethindrone -ethinyl estradiol -iron (LARIN FE 1.5/30) 1.5-30 MG-MCG tablet    Sig: Take 1 tablet by mouth daily.    Dispense:  84 tablet    Refill:  3    Supervising Provider:   JAYNE VONN DEL  [[id number]]       Plan:     Return in 1 year for pap and physical     "

## 2025-01-10 ENCOUNTER — Ambulatory Visit: Payer: Self-pay | Admitting: Adult Health
